# Patient Record
Sex: Female | Born: 1945
Health system: Southern US, Community
[De-identification: ages and names within clinical notes are randomized; demographics above are authoritative.]

## PROBLEM LIST (undated history)

## (undated) DIAGNOSIS — E890 Postprocedural hypothyroidism: Secondary | ICD-10-CM

## (undated) DIAGNOSIS — Z862 Personal history of diseases of the blood and blood-forming organs and certain disorders involving the immune mechanism: Secondary | ICD-10-CM

## (undated) DIAGNOSIS — R0982 Postnasal drip: Secondary | ICD-10-CM

## (undated) DIAGNOSIS — E559 Vitamin D deficiency, unspecified: Secondary | ICD-10-CM

## (undated) DIAGNOSIS — R011 Cardiac murmur, unspecified: Secondary | ICD-10-CM

## (undated) DIAGNOSIS — I10 Essential (primary) hypertension: Secondary | ICD-10-CM

## (undated) DIAGNOSIS — E210 Primary hyperparathyroidism: Secondary | ICD-10-CM

## (undated) DIAGNOSIS — E119 Type 2 diabetes mellitus without complications: Secondary | ICD-10-CM

## (undated) DIAGNOSIS — M1990 Unspecified osteoarthritis, unspecified site: Secondary | ICD-10-CM

## (undated) DIAGNOSIS — Z972 Presence of dental prosthetic device (complete) (partial): Secondary | ICD-10-CM

## (undated) DIAGNOSIS — Z8679 Personal history of other diseases of the circulatory system: Secondary | ICD-10-CM

## (undated) DIAGNOSIS — I4510 Unspecified right bundle-branch block: Secondary | ICD-10-CM

## (undated) DIAGNOSIS — Z8774 Personal history of (corrected) congenital malformations of heart and circulatory system: Secondary | ICD-10-CM

## (undated) HISTORY — PX: TONSILLECTOMY: SUR1361

## (undated) HISTORY — PX: CATARACT EXTRACTION W/ INTRAOCULAR LENS  IMPLANT, BILATERAL: SHX1307

## (undated) HISTORY — PX: BREAST LUMPECTOMY: SHX2

## (undated) HISTORY — PX: VSD REPAIR: SHX276

---

## 2004-08-24 ENCOUNTER — Encounter (HOSPITAL_COMMUNITY): Admission: RE | Admit: 2004-08-24 | Discharge: 2004-11-22 | Payer: Self-pay | Admitting: Internal Medicine

## 2005-01-27 ENCOUNTER — Encounter (HOSPITAL_COMMUNITY): Admission: RE | Admit: 2005-01-27 | Discharge: 2005-04-27 | Payer: Self-pay | Admitting: Internal Medicine

## 2005-06-15 ENCOUNTER — Ambulatory Visit: Payer: Self-pay | Admitting: Internal Medicine

## 2005-07-05 ENCOUNTER — Encounter (HOSPITAL_COMMUNITY): Admission: RE | Admit: 2005-07-05 | Discharge: 2005-10-03 | Payer: Self-pay | Admitting: Endocrinology

## 2005-08-05 ENCOUNTER — Ambulatory Visit (HOSPITAL_COMMUNITY): Admission: RE | Admit: 2005-08-05 | Discharge: 2005-08-05 | Payer: Self-pay | Admitting: Internal Medicine

## 2005-09-06 ENCOUNTER — Emergency Department (HOSPITAL_COMMUNITY): Admission: EM | Admit: 2005-09-06 | Discharge: 2005-09-07 | Payer: Self-pay | Admitting: Emergency Medicine

## 2008-01-01 ENCOUNTER — Encounter: Admission: RE | Admit: 2008-01-01 | Discharge: 2008-01-01 | Payer: Self-pay | Admitting: Endocrinology

## 2008-01-23 ENCOUNTER — Encounter: Admission: RE | Admit: 2008-01-23 | Discharge: 2008-01-23 | Payer: Self-pay | Admitting: Endocrinology

## 2008-01-23 ENCOUNTER — Other Ambulatory Visit: Admission: RE | Admit: 2008-01-23 | Discharge: 2008-01-23 | Payer: Self-pay | Admitting: Interventional Radiology

## 2008-01-23 ENCOUNTER — Encounter (INDEPENDENT_AMBULATORY_CARE_PROVIDER_SITE_OTHER): Payer: Self-pay | Admitting: Interventional Radiology

## 2008-05-06 ENCOUNTER — Encounter: Admission: RE | Admit: 2008-05-06 | Discharge: 2008-05-06 | Payer: Self-pay | Admitting: Endocrinology

## 2009-06-12 ENCOUNTER — Encounter: Admission: RE | Admit: 2009-06-12 | Discharge: 2009-06-12 | Payer: Self-pay | Admitting: Endocrinology

## 2010-12-04 ENCOUNTER — Encounter: Payer: Self-pay | Admitting: Internal Medicine

## 2011-05-02 ENCOUNTER — Other Ambulatory Visit: Payer: Self-pay | Admitting: Endocrinology

## 2011-05-02 DIAGNOSIS — E049 Nontoxic goiter, unspecified: Secondary | ICD-10-CM

## 2011-05-03 ENCOUNTER — Ambulatory Visit
Admission: RE | Admit: 2011-05-03 | Discharge: 2011-05-03 | Disposition: A | Discharge status: Home / Self Care | Payer: Medicare Other | Source: Ambulatory Visit | Attending: Endocrinology | Admitting: Endocrinology

## 2011-05-03 DIAGNOSIS — E049 Nontoxic goiter, unspecified: Secondary | ICD-10-CM

## 2013-10-14 ENCOUNTER — Other Ambulatory Visit (HOSPITAL_COMMUNITY): Payer: Self-pay | Admitting: Endocrinology

## 2013-10-14 DIAGNOSIS — Z1231 Encounter for screening mammogram for malignant neoplasm of breast: Secondary | ICD-10-CM

## 2013-10-25 ENCOUNTER — Ambulatory Visit (HOSPITAL_COMMUNITY)
Admission: RE | Admit: 2013-10-25 | Discharge: 2013-10-25 | Disposition: A | Discharge status: Home / Self Care | Payer: Medicare Other | Source: Ambulatory Visit | Attending: Endocrinology | Admitting: Endocrinology

## 2013-10-25 DIAGNOSIS — Z1231 Encounter for screening mammogram for malignant neoplasm of breast: Secondary | ICD-10-CM | POA: Insufficient documentation

## 2014-12-12 DIAGNOSIS — E119 Type 2 diabetes mellitus without complications: Secondary | ICD-10-CM | POA: Diagnosis not present

## 2014-12-12 DIAGNOSIS — Z961 Presence of intraocular lens: Secondary | ICD-10-CM | POA: Diagnosis not present

## 2014-12-12 DIAGNOSIS — H04123 Dry eye syndrome of bilateral lacrimal glands: Secondary | ICD-10-CM | POA: Diagnosis not present

## 2014-12-12 DIAGNOSIS — H26493 Other secondary cataract, bilateral: Secondary | ICD-10-CM | POA: Diagnosis not present

## 2015-04-03 DIAGNOSIS — E780 Pure hypercholesterolemia: Secondary | ICD-10-CM | POA: Diagnosis not present

## 2015-04-03 DIAGNOSIS — E039 Hypothyroidism, unspecified: Secondary | ICD-10-CM | POA: Diagnosis not present

## 2015-04-03 DIAGNOSIS — R7301 Impaired fasting glucose: Secondary | ICD-10-CM | POA: Diagnosis not present

## 2015-04-08 DIAGNOSIS — R7301 Impaired fasting glucose: Secondary | ICD-10-CM | POA: Diagnosis not present

## 2015-04-08 DIAGNOSIS — M810 Age-related osteoporosis without current pathological fracture: Secondary | ICD-10-CM | POA: Diagnosis not present

## 2015-04-08 DIAGNOSIS — E039 Hypothyroidism, unspecified: Secondary | ICD-10-CM | POA: Diagnosis not present

## 2015-04-08 DIAGNOSIS — I10 Essential (primary) hypertension: Secondary | ICD-10-CM | POA: Diagnosis not present

## 2015-06-02 DIAGNOSIS — E039 Hypothyroidism, unspecified: Secondary | ICD-10-CM | POA: Diagnosis not present

## 2015-06-22 DIAGNOSIS — W57XXXA Bitten or stung by nonvenomous insect and other nonvenomous arthropods, initial encounter: Secondary | ICD-10-CM | POA: Diagnosis not present

## 2015-06-22 DIAGNOSIS — S80862A Insect bite (nonvenomous), left lower leg, initial encounter: Secondary | ICD-10-CM | POA: Diagnosis not present

## 2015-09-01 DIAGNOSIS — H43812 Vitreous degeneration, left eye: Secondary | ICD-10-CM | POA: Diagnosis not present

## 2015-10-05 DIAGNOSIS — E7800 Pure hypercholesterolemia, unspecified: Secondary | ICD-10-CM | POA: Diagnosis not present

## 2015-10-05 DIAGNOSIS — H43812 Vitreous degeneration, left eye: Secondary | ICD-10-CM | POA: Diagnosis not present

## 2015-10-05 DIAGNOSIS — E039 Hypothyroidism, unspecified: Secondary | ICD-10-CM | POA: Diagnosis not present

## 2015-10-05 DIAGNOSIS — R7301 Impaired fasting glucose: Secondary | ICD-10-CM | POA: Diagnosis not present

## 2015-11-10 DIAGNOSIS — E039 Hypothyroidism, unspecified: Secondary | ICD-10-CM | POA: Diagnosis not present

## 2015-11-17 DIAGNOSIS — R7301 Impaired fasting glucose: Secondary | ICD-10-CM | POA: Diagnosis not present

## 2015-11-17 DIAGNOSIS — M810 Age-related osteoporosis without current pathological fracture: Secondary | ICD-10-CM | POA: Diagnosis not present

## 2015-11-17 DIAGNOSIS — I10 Essential (primary) hypertension: Secondary | ICD-10-CM | POA: Diagnosis not present

## 2015-11-17 DIAGNOSIS — E039 Hypothyroidism, unspecified: Secondary | ICD-10-CM | POA: Diagnosis not present

## 2016-01-12 DIAGNOSIS — E039 Hypothyroidism, unspecified: Secondary | ICD-10-CM | POA: Diagnosis not present

## 2016-01-12 DIAGNOSIS — R7301 Impaired fasting glucose: Secondary | ICD-10-CM | POA: Diagnosis not present

## 2016-01-17 DIAGNOSIS — J069 Acute upper respiratory infection, unspecified: Secondary | ICD-10-CM | POA: Diagnosis not present

## 2016-02-02 DIAGNOSIS — R300 Dysuria: Secondary | ICD-10-CM | POA: Diagnosis not present

## 2016-03-22 DIAGNOSIS — E039 Hypothyroidism, unspecified: Secondary | ICD-10-CM | POA: Diagnosis not present

## 2016-05-08 DIAGNOSIS — S40862A Insect bite (nonvenomous) of left upper arm, initial encounter: Secondary | ICD-10-CM | POA: Diagnosis not present

## 2016-05-08 DIAGNOSIS — W57XXXA Bitten or stung by nonvenomous insect and other nonvenomous arthropods, initial encounter: Secondary | ICD-10-CM | POA: Diagnosis not present

## 2016-05-31 DIAGNOSIS — E039 Hypothyroidism, unspecified: Secondary | ICD-10-CM | POA: Diagnosis not present

## 2016-05-31 DIAGNOSIS — E7800 Pure hypercholesterolemia, unspecified: Secondary | ICD-10-CM | POA: Diagnosis not present

## 2016-05-31 DIAGNOSIS — R7301 Impaired fasting glucose: Secondary | ICD-10-CM | POA: Diagnosis not present

## 2016-06-06 DIAGNOSIS — I10 Essential (primary) hypertension: Secondary | ICD-10-CM | POA: Diagnosis not present

## 2016-06-06 DIAGNOSIS — E039 Hypothyroidism, unspecified: Secondary | ICD-10-CM | POA: Diagnosis not present

## 2016-06-06 DIAGNOSIS — R7301 Impaired fasting glucose: Secondary | ICD-10-CM | POA: Diagnosis not present

## 2016-06-06 DIAGNOSIS — M810 Age-related osteoporosis without current pathological fracture: Secondary | ICD-10-CM | POA: Diagnosis not present

## 2016-07-06 DIAGNOSIS — R2232 Localized swelling, mass and lump, left upper limb: Secondary | ICD-10-CM | POA: Diagnosis not present

## 2016-10-03 DIAGNOSIS — E039 Hypothyroidism, unspecified: Secondary | ICD-10-CM | POA: Diagnosis not present

## 2016-12-07 DIAGNOSIS — E7800 Pure hypercholesterolemia, unspecified: Secondary | ICD-10-CM | POA: Diagnosis not present

## 2016-12-07 DIAGNOSIS — I10 Essential (primary) hypertension: Secondary | ICD-10-CM | POA: Diagnosis not present

## 2016-12-07 DIAGNOSIS — E039 Hypothyroidism, unspecified: Secondary | ICD-10-CM | POA: Diagnosis not present

## 2016-12-07 DIAGNOSIS — M810 Age-related osteoporosis without current pathological fracture: Secondary | ICD-10-CM | POA: Diagnosis not present

## 2016-12-07 DIAGNOSIS — R7301 Impaired fasting glucose: Secondary | ICD-10-CM | POA: Diagnosis not present

## 2016-12-07 DIAGNOSIS — Z23 Encounter for immunization: Secondary | ICD-10-CM | POA: Diagnosis not present

## 2016-12-23 ENCOUNTER — Other Ambulatory Visit: Payer: Self-pay | Admitting: Endocrinology

## 2016-12-23 DIAGNOSIS — M810 Age-related osteoporosis without current pathological fracture: Secondary | ICD-10-CM

## 2016-12-23 DIAGNOSIS — Z1231 Encounter for screening mammogram for malignant neoplasm of breast: Secondary | ICD-10-CM

## 2017-04-10 DIAGNOSIS — L0390 Cellulitis, unspecified: Secondary | ICD-10-CM | POA: Diagnosis not present

## 2017-04-10 DIAGNOSIS — W57XXXA Bitten or stung by nonvenomous insect and other nonvenomous arthropods, initial encounter: Secondary | ICD-10-CM | POA: Diagnosis not present

## 2017-05-30 DIAGNOSIS — R7301 Impaired fasting glucose: Secondary | ICD-10-CM | POA: Diagnosis not present

## 2017-05-30 DIAGNOSIS — E7800 Pure hypercholesterolemia, unspecified: Secondary | ICD-10-CM | POA: Diagnosis not present

## 2017-05-30 DIAGNOSIS — E559 Vitamin D deficiency, unspecified: Secondary | ICD-10-CM | POA: Diagnosis not present

## 2017-05-30 DIAGNOSIS — E039 Hypothyroidism, unspecified: Secondary | ICD-10-CM | POA: Diagnosis not present

## 2017-07-26 DIAGNOSIS — R7301 Impaired fasting glucose: Secondary | ICD-10-CM | POA: Diagnosis not present

## 2017-07-26 DIAGNOSIS — E039 Hypothyroidism, unspecified: Secondary | ICD-10-CM | POA: Diagnosis not present

## 2017-07-26 DIAGNOSIS — I10 Essential (primary) hypertension: Secondary | ICD-10-CM | POA: Diagnosis not present

## 2017-07-26 DIAGNOSIS — M810 Age-related osteoporosis without current pathological fracture: Secondary | ICD-10-CM | POA: Diagnosis not present

## 2017-08-10 ENCOUNTER — Ambulatory Visit
Admission: RE | Admit: 2017-08-10 | Discharge: 2017-08-10 | Disposition: A | Discharge status: Home / Self Care | Payer: Medicare Other | Source: Ambulatory Visit | Attending: Endocrinology | Admitting: Endocrinology

## 2017-08-10 DIAGNOSIS — Z1231 Encounter for screening mammogram for malignant neoplasm of breast: Secondary | ICD-10-CM | POA: Diagnosis not present

## 2017-08-31 DIAGNOSIS — Z23 Encounter for immunization: Secondary | ICD-10-CM | POA: Diagnosis not present

## 2017-08-31 DIAGNOSIS — I10 Essential (primary) hypertension: Secondary | ICD-10-CM | POA: Diagnosis not present

## 2018-01-29 DIAGNOSIS — R7301 Impaired fasting glucose: Secondary | ICD-10-CM | POA: Diagnosis not present

## 2018-01-29 DIAGNOSIS — E039 Hypothyroidism, unspecified: Secondary | ICD-10-CM | POA: Diagnosis not present

## 2018-01-29 DIAGNOSIS — E559 Vitamin D deficiency, unspecified: Secondary | ICD-10-CM | POA: Diagnosis not present

## 2018-01-31 ENCOUNTER — Other Ambulatory Visit: Payer: Self-pay | Admitting: Endocrinology

## 2018-01-31 DIAGNOSIS — R5381 Other malaise: Secondary | ICD-10-CM

## 2018-01-31 DIAGNOSIS — I10 Essential (primary) hypertension: Secondary | ICD-10-CM | POA: Diagnosis not present

## 2018-01-31 DIAGNOSIS — R7301 Impaired fasting glucose: Secondary | ICD-10-CM | POA: Diagnosis not present

## 2018-01-31 DIAGNOSIS — E039 Hypothyroidism, unspecified: Secondary | ICD-10-CM | POA: Diagnosis not present

## 2018-02-07 ENCOUNTER — Other Ambulatory Visit: Payer: Self-pay | Admitting: Endocrinology

## 2018-02-07 DIAGNOSIS — M8580 Other specified disorders of bone density and structure, unspecified site: Secondary | ICD-10-CM

## 2018-02-14 ENCOUNTER — Ambulatory Visit
Admission: RE | Admit: 2018-02-14 | Discharge: 2018-02-14 | Disposition: A | Discharge status: Home / Self Care | Payer: Medicare Other | Source: Ambulatory Visit | Attending: Endocrinology | Admitting: Endocrinology

## 2018-02-14 DIAGNOSIS — M85852 Other specified disorders of bone density and structure, left thigh: Secondary | ICD-10-CM | POA: Diagnosis not present

## 2018-02-14 DIAGNOSIS — M810 Age-related osteoporosis without current pathological fracture: Secondary | ICD-10-CM | POA: Diagnosis not present

## 2018-02-14 DIAGNOSIS — M8580 Other specified disorders of bone density and structure, unspecified site: Secondary | ICD-10-CM

## 2018-02-14 DIAGNOSIS — Z780 Asymptomatic menopausal state: Secondary | ICD-10-CM | POA: Diagnosis not present

## 2018-03-30 DIAGNOSIS — E559 Vitamin D deficiency, unspecified: Secondary | ICD-10-CM | POA: Diagnosis not present

## 2018-04-02 DIAGNOSIS — S80862A Insect bite (nonvenomous), left lower leg, initial encounter: Secondary | ICD-10-CM | POA: Diagnosis not present

## 2018-04-02 DIAGNOSIS — L03116 Cellulitis of left lower limb: Secondary | ICD-10-CM | POA: Diagnosis not present

## 2018-04-02 DIAGNOSIS — W57XXXA Bitten or stung by nonvenomous insect and other nonvenomous arthropods, initial encounter: Secondary | ICD-10-CM | POA: Diagnosis not present

## 2018-07-30 DIAGNOSIS — J40 Bronchitis, not specified as acute or chronic: Secondary | ICD-10-CM | POA: Diagnosis not present

## 2018-09-17 DIAGNOSIS — R7301 Impaired fasting glucose: Secondary | ICD-10-CM | POA: Diagnosis not present

## 2018-09-17 DIAGNOSIS — E559 Vitamin D deficiency, unspecified: Secondary | ICD-10-CM | POA: Diagnosis not present

## 2018-09-17 DIAGNOSIS — E039 Hypothyroidism, unspecified: Secondary | ICD-10-CM | POA: Diagnosis not present

## 2018-09-24 DIAGNOSIS — R7301 Impaired fasting glucose: Secondary | ICD-10-CM | POA: Diagnosis not present

## 2018-09-24 DIAGNOSIS — M810 Age-related osteoporosis without current pathological fracture: Secondary | ICD-10-CM | POA: Diagnosis not present

## 2018-09-24 DIAGNOSIS — E039 Hypothyroidism, unspecified: Secondary | ICD-10-CM | POA: Diagnosis not present

## 2018-09-24 DIAGNOSIS — I10 Essential (primary) hypertension: Secondary | ICD-10-CM | POA: Diagnosis not present

## 2018-09-24 DIAGNOSIS — Z23 Encounter for immunization: Secondary | ICD-10-CM | POA: Diagnosis not present

## 2018-10-04 ENCOUNTER — Other Ambulatory Visit (HOSPITAL_COMMUNITY): Payer: Self-pay | Admitting: Endocrinology

## 2018-10-04 DIAGNOSIS — E210 Primary hyperparathyroidism: Secondary | ICD-10-CM

## 2018-10-24 ENCOUNTER — Encounter (HOSPITAL_COMMUNITY)
Admission: RE | Admit: 2018-10-24 | Discharge: 2018-10-24 | Disposition: A | Discharge status: Home / Self Care | Payer: Medicare Other | Source: Ambulatory Visit | Attending: Endocrinology | Admitting: Endocrinology

## 2018-10-24 DIAGNOSIS — E213 Hyperparathyroidism, unspecified: Secondary | ICD-10-CM | POA: Diagnosis not present

## 2018-10-24 DIAGNOSIS — E210 Primary hyperparathyroidism: Secondary | ICD-10-CM

## 2018-10-24 MED ORDER — TECHNETIUM TC 99M SESTAMIBI - CARDIOLITE
20.0000 | Freq: Once | INTRAVENOUS | Status: AC | PRN
  Administered 2018-10-24: 09:00:00 20 via INTRAVENOUS

## 2018-11-29 ENCOUNTER — Other Ambulatory Visit: Payer: Self-pay | Admitting: Endocrinology

## 2018-11-29 DIAGNOSIS — R319 Hematuria, unspecified: Secondary | ICD-10-CM

## 2018-11-29 DIAGNOSIS — R350 Frequency of micturition: Secondary | ICD-10-CM | POA: Diagnosis not present

## 2018-11-29 DIAGNOSIS — Z7689 Persons encountering health services in other specified circumstances: Secondary | ICD-10-CM | POA: Diagnosis not present

## 2018-11-30 ENCOUNTER — Ambulatory Visit
Admission: RE | Admit: 2018-11-30 | Discharge: 2018-11-30 | Disposition: A | Discharge status: Home / Self Care | Payer: Medicare Other | Source: Ambulatory Visit | Attending: Endocrinology | Admitting: Endocrinology

## 2018-11-30 DIAGNOSIS — R319 Hematuria, unspecified: Secondary | ICD-10-CM | POA: Diagnosis not present

## 2019-01-07 ENCOUNTER — Ambulatory Visit: Payer: Self-pay | Admitting: Surgery

## 2019-01-07 ENCOUNTER — Other Ambulatory Visit: Payer: Self-pay | Admitting: Surgery

## 2019-01-07 DIAGNOSIS — E210 Primary hyperparathyroidism: Secondary | ICD-10-CM | POA: Diagnosis not present

## 2019-01-10 ENCOUNTER — Ambulatory Visit
Admission: RE | Admit: 2019-01-10 | Discharge: 2019-01-10 | Disposition: A | Discharge status: Home / Self Care | Payer: Medicare Other | Source: Ambulatory Visit | Attending: Surgery | Admitting: Surgery

## 2019-01-10 ENCOUNTER — Other Ambulatory Visit: Payer: Medicare Other

## 2019-01-10 DIAGNOSIS — E041 Nontoxic single thyroid nodule: Secondary | ICD-10-CM | POA: Diagnosis not present

## 2019-01-10 DIAGNOSIS — E210 Primary hyperparathyroidism: Secondary | ICD-10-CM

## 2019-01-14 ENCOUNTER — Other Ambulatory Visit: Payer: Medicare Other

## 2019-01-14 NOTE — Patient Instructions (Signed)
Laura Madden  01/14/2019   Your procedure is scheduled on:  01-22-2019   Report to [facility] Main  Entrance,  Report to admitting at  5:30 AM    Call this number if you have problems the morning of surgery [phone]        Remember: Do not eat food or drink liquids :After Midnight. This includes no water, candy, mints   BRUSH YOUR TEETH MORNING OF SURGERY AND RINSE YOUR MOUTH OUT       Take these medicines the morning of surgery with A SIP OF WATER:  Levothyroxine (synthroid)  DO NOT TAKE ANY DIABETIC MEDICATIONS DAY OF YOUR SURGERY                                  You may not have any metal on your body including hair pins and              piercings  Do not wear jewelry, make-up, lotions, powders or perfumes, deodorant             Do not wear nail polish.  Do not shave  48 hours prior to surgery.                 Do not bring valuables to the hospital. [place].  Contacts, dentures or bridgework may not be worn into surgery.       Patients discharged the day of surgery will not be allowed to drive home. IF YOU ARE HAVING SURGERY AND GOING HOME THE SAME DAY, YOU MUST HAVE AN ADULT TO DRIVE YOU HOME AND BE WITH YOU FOR 24 HOURS. YOU MAY GO HOME BY TAXI OR UBER OR ORTHERWISE, BUT AN ADULT MUST ACCOMPANY YOU HOME AND STAY WITH YOU FOR 24 HOURS.    Name and phone number of your driver:               _____________________________________________________________________             [facility] - Preparing for Surgery Before surgery, you can play an important role.  Because skin is not sterile, your skin needs to be as free of germs as possible.  You can reduce the number of germs on your skin by washing with CHG (chlorahexidine gluconate) soap before surgery.  CHG is an antiseptic cleaner which kills germs and bonds with the skin to continue killing germs even after washing. Please DO NOT  use if you have an allergy to CHG or antibacterial soaps.  If your skin becomes reddened/irritated stop using the CHG and inform your nurse when you arrive at Short Stay. Do not shave (including legs and underarms) for at least 48 hours prior to the first CHG shower.  You may shave your face/neck. Please follow these instructions carefully:  1.  Shower with CHG Soap the night before surgery and the  morning of Surgery.  2.  If you choose to wash your hair, wash your hair first as usual with your  normal  shampoo.  3.  After you shampoo, rinse your hair and body thoroughly to remove the  shampoo.  4.  Use CHG as you would any other liquid soap.  You can apply chg directly  to the skin and wash                       Gently with a scrungie or clean washcloth.  5.  Apply the CHG Soap to your body ONLY FROM THE NECK DOWN.   Do not use on face/ open                           Wound or open sores. Avoid contact with eyes, ears mouth and genitals (private parts).                       Wash face,  Genitals (private parts) with your normal soap.             6.  Wash thoroughly, paying special attention to the area where your surgery  will be performed.  7.  Thoroughly rinse your body with warm water from the neck down.  8.  DO NOT shower/wash with your normal soap after using and rinsing off  the CHG Soap.             9.  Pat yourself dry with a clean towel.            10.  Wear clean pajamas.            11.  Place clean sheets on your bed the night of your first shower and do not  sleep with pets. Day of Surgery : Do not apply any lotions/deodorants the morning of surgery.  Please wear clean clothes to the hospital/surgery center.  FAILURE TO FOLLOW THESE INSTRUCTIONS MAY RESULT IN THE CANCELLATION OF YOUR SURGERY PATIENT SIGNATURE_________________________________  NURSE  SIGNATURE__________________________________  ________________________________________________________________________

## 2019-01-15 ENCOUNTER — Encounter (HOSPITAL_COMMUNITY)
Admission: RE | Admit: 2019-01-15 | Discharge: 2019-01-15 | Disposition: A | Discharge status: Home / Self Care | Payer: Medicare Other | Source: Ambulatory Visit | Attending: Surgery | Admitting: Surgery

## 2019-01-15 ENCOUNTER — Ambulatory Visit (HOSPITAL_COMMUNITY)
Admission: RE | Admit: 2019-01-15 | Discharge: 2019-01-15 | Disposition: A | Discharge status: Home / Self Care | Payer: Medicare Other | Source: Ambulatory Visit | Attending: Anesthesiology | Admitting: Anesthesiology

## 2019-01-15 ENCOUNTER — Encounter (HOSPITAL_COMMUNITY): Payer: Self-pay

## 2019-01-15 ENCOUNTER — Other Ambulatory Visit: Payer: Self-pay

## 2019-01-15 DIAGNOSIS — Z01818 Encounter for other preprocedural examination: Secondary | ICD-10-CM | POA: Insufficient documentation

## 2019-01-15 DIAGNOSIS — R05 Cough: Secondary | ICD-10-CM | POA: Diagnosis not present

## 2019-01-15 HISTORY — DX: Postprocedural hypothyroidism: E89.0

## 2019-01-15 HISTORY — DX: Unspecified osteoarthritis, unspecified site: M19.90

## 2019-01-15 HISTORY — DX: Vitamin D deficiency, unspecified: E55.9

## 2019-01-15 HISTORY — DX: Personal history of other diseases of the circulatory system: Z86.79

## 2019-01-15 HISTORY — DX: Essential (primary) hypertension: I10

## 2019-01-15 HISTORY — DX: Type 2 diabetes mellitus without complications: E11.9

## 2019-01-15 HISTORY — DX: Primary hyperparathyroidism: E21.0

## 2019-01-15 HISTORY — DX: Postnasal drip: R09.82

## 2019-01-15 HISTORY — DX: Unspecified right bundle-branch block: I45.10

## 2019-01-15 HISTORY — DX: Personal history of diseases of the blood and blood-forming organs and certain disorders involving the immune mechanism: Z86.2

## 2019-01-15 HISTORY — DX: Cardiac murmur, unspecified: R01.1

## 2019-01-15 HISTORY — DX: Presence of dental prosthetic device (complete) (partial): Z97.2

## 2019-01-15 HISTORY — DX: Personal history of (corrected) congenital malformations of heart and circulatory system: Z87.74

## 2019-01-15 LAB — CBC
HEMATOCRIT: 47 % — AB (ref 36.0–46.0)
Hemoglobin: 14.8 g/dL (ref 12.0–15.0)
MCH: 30.5 pg (ref 26.0–34.0)
MCHC: 31.5 g/dL (ref 30.0–36.0)
MCV: 96.9 fL (ref 80.0–100.0)
Platelets: 221 10*3/uL (ref 150–400)
RBC: 4.85 MIL/uL (ref 3.87–5.11)
RDW: 12.8 % (ref 11.5–15.5)
WBC: 11.3 10*3/uL — ABNORMAL HIGH (ref 4.0–10.5)
nRBC: 0 % (ref 0.0–0.2)

## 2019-01-15 LAB — BASIC METABOLIC PANEL
Anion gap: 7 (ref 5–15)
BUN: 14 mg/dL (ref 8–23)
CO2: 24 mmol/L (ref 22–32)
Calcium: 10.3 mg/dL (ref 8.9–10.3)
Chloride: 105 mmol/L (ref 98–111)
Creatinine, Ser: 0.6 mg/dL (ref 0.44–1.00)
GFR calc Af Amer: >60 mL/min (ref >60)
GFR calc non Af Amer: >60 mL/min (ref >60)
GLUCOSE: 107 mg/dL — AB (ref 70–99)
Potassium: 4.2 mmol/L (ref 3.5–5.1)
Sodium: 136 mmol/L (ref 135–145)

## 2019-01-15 LAB — HEMOGLOBIN A1C
Hgb A1c MFr Bld: 5.8 % — ABNORMAL HIGH (ref 4.8–5.6)
Mean Plasma Glucose: 119.76 mg/dL

## 2019-01-15 LAB — GLUCOSE, CAPILLARY: Glucose-Capillary: 140 mg/dL — ABNORMAL HIGH (ref 70–99)

## 2019-01-15 MED ORDER — CHLORHEXIDINE GLUCONATE CLOTH 2 % EX PADS
6.0000 | MEDICATED_PAD | Freq: Once | CUTANEOUS | Status: DC
  Filled 2019-01-15: qty 6

## 2019-01-15 NOTE — Progress Notes (Addendum)
Final CXR dated 01-15-2019 in epic.  Addendum:  Final EKG dated 01-15-2019 in epic. Chart to anesthesia for review, Konrad Felix PA.

## 2019-01-17 NOTE — Progress Notes (Signed)
Anesthesia Chart Review   Case:  [id number] Date/Time:  01/22/19 0715   Procedure:  LEFT INFERIOR PARATHYROIDECTOMY (Left )   Anesthesia type:  General   Pre-op diagnosis:  primary hyperparathyroidism   Location:  [facility] ROOM 04 / [facility]   Surgeon:  Armandina Gemma, MD      DISCUSSION: 73 yo former smoker (quit 01/14/65) with h/o hypothyroidism, HTN, RBBB, DM II, s/p VSD repair age 39, primary hyperparathyroidism scheduled for above procedure 01/22/19 with Dr. Armandina Gemma.   Pt can proceed with planned procedure barring acute status change.  VS: BP (!) 162/88   Pulse 87   Temp 37.3 C (Oral)   Resp 14   Ht 5\' 7"  (1.702 m)   Wt 69.5 kg   SpO2 100%   BMI 24.00 kg/m   PROVIDERS: Jani Gravel, MD is PCP   LABS: Labs reviewed: Acceptable for surgery. (all labs ordered are listed, but only abnormal results are displayed)  Labs Reviewed  HEMOGLOBIN A1C - Abnormal; Notable for the following components:      Result Value   Hgb A1c MFr Bld 5.8 (*)    All other components within normal limits  BASIC METABOLIC PANEL - Abnormal; Notable for the following components:   Glucose, Bld 107 (*)    All other components within normal limits  CBC - Abnormal; Notable for the following components:   WBC 11.3 (*)    HCT 47.0 (*)    All other components within normal limits  GLUCOSE, CAPILLARY - Abnormal; Notable for the following components:   Glucose-Capillary 140 (*)    All other components within normal limits     IMAGES: Chest Xray 01/15/2019 IMPRESSION: Mild streaky anterior lung base opacities partially obscuring the heart borders bilaterally, favor scarring or atelectasis. No acute consolidative airspace disease to suggest a pneumonia.  EKG: 01/15/2019 Rate 79 bpm Normal sinus rhythm  Left axis deviation  Right bundle branch block  Abnormal ecg  CV:  Past Medical History:  Diagnosis Date  . Arthritis    hips  . Heart murmur   . History of anemia   . History of CHF (congestive  heart failure)    as child--- resolved after VSP repair age 61  . Hypertension   . Hypothyroidism, postradioiodine therapy endocrinologist-- dr balan   x2  --  10/ 2005 and 09/ 2006  . Post-nasal drip    occasional cough   . Primary hyperparathyroidism ([place])   . RBBB (right bundle branch block)   . S/P VSD repair age 42   for congenital PFO---  per pt last visit to cardiologist at [facility] approx. 2006  . Type 2 diabetes mellitus ([place])    endocriologist-- dr Chalmers Cater  . Vitamin D deficiency   . Wears partial dentures    lower    Past Surgical History:  Procedure Laterality Date  . BREAST LUMPECTOMY Left age 47  . CATARACT EXTRACTION W/ INTRAOCULAR LENS  IMPLANT, BILATERAL  2013 approx.  . TONSILLECTOMY  age 38  . VSD REPAIR  age 25    MEDICATIONS: . Cholecalciferol (VITAMIN D3) 1.25 MG (50000 UT) CAPS  . levothyroxine (SYNTHROID, LEVOTHROID) 100 MCG tablet  . levothyroxine (SYNTHROID, LEVOTHROID) 88 MCG tablet  . lisinopril (PRINIVIL,ZESTRIL) 10 MG tablet  . metFORMIN (GLUCOPHAGE-XR) 750 MG 24 hr tablet  . Polyethyl Glycol-Propyl Glycol (SYSTANE OP)   No current facility-administered medications for this encounter.    Maia Plan [facility] Pre-Surgical Testing [phone] 01/17/19 4:39 PM

## 2019-01-21 ENCOUNTER — Encounter (HOSPITAL_COMMUNITY): Payer: Self-pay | Admitting: Surgery

## 2019-01-21 DIAGNOSIS — E210 Primary hyperparathyroidism: Secondary | ICD-10-CM | POA: Diagnosis present

## 2019-01-21 NOTE — Anesthesia Preprocedure Evaluation (Addendum)
Anesthesia Evaluation  Patient identified by MRN, date of birth, ID band Patient awake    Reviewed: Allergy & Precautions, NPO status , Patient's Chart, lab work & pertinent test results  Airway Mallampati: II  TM Distance: >3 FB Neck ROM: Full    Dental no notable dental hx. (+) Teeth Intact, Dental Advisory Given   Pulmonary neg pulmonary ROS, former smoker,    Pulmonary exam normal breath sounds clear to auscultation       Cardiovascular hypertension, Normal cardiovascular exam+ Valvular Problems/Murmurs (h/o VSD s/p repair at age 33)  Rhythm:Regular Rate:Normal  TTE 2013 NORMAL LEFT VENTRICULAR FUNCTION VALVULAR REGURGITATION: MILD MR, TRIVIAL PR, MILD TR NO VALVULAR STENOSIS MODERATE PULMONARY HYPERTENSION; POSITIVE MICROCAVITATION STUDY VSD REPAIR IN '59, NO RESIDUAL SHUNTING NOTED  RHC 2013 Mildly elevated right sided pressures related to diastolic dysfunction with elevated PCWP and LVEDP. Systemic hypertension.  Unable to cross or locate PFO. No evidence for anomalous pulmonary venous return.  High output likely related to hyperthyroidism   Neuro/Psych negative neurological ROS  negative psych ROS   GI/Hepatic negative GI ROS, Neg liver ROS,   Endo/Other  diabetes, Oral Hypoglycemic AgentsHypothyroidism Primary hyperparathyroidism  Renal/GU negative Renal ROS  negative genitourinary   Musculoskeletal  (+) Arthritis ,   Abdominal   Peds  Hematology negative hematology ROS (+)   Anesthesia Other Findings   Reproductive/Obstetrics                            Anesthesia Physical Anesthesia Plan  ASA: III  Anesthesia Plan: General   Post-op Pain Management:    Induction: Intravenous  PONV Risk Score and Plan: 3 and Ondansetron, Dexamethasone and Treatment may vary due to age or medical condition  Airway Management Planned: Oral ETT  Additional Equipment:   Intra-op Plan:    Post-operative Plan: Extubation in OR  Informed Consent: I have reviewed the patients History and Physical, chart, labs and discussed the procedure including the risks, benefits and alternatives for the proposed anesthesia with the patient or authorized representative who has indicated his/her understanding and acceptance.     Dental advisory given  Plan Discussed with: CRNA  Anesthesia Plan Comments:         Anesthesia Quick Evaluation

## 2019-01-21 NOTE — H&P (Signed)
General Surgery [facility]  Laura Madden DOB: Aug 07, 1946 Married / Language: Cleophus Molt / Race: White Female   History of Present Illness   The patient is a 73 year old female who presents with primary hyperparathyroidism.  CHIEF COMPLAINT: primary hyperparathyroidism  Patient is referred by Dr. Jacelyn Pi for surgical evaluation and management of primary hyperparathyroidism. Patient was noted to have elevated serum calcium levels. These were persistently elevated with her most recent level being 10.7. Concurrent with this was an intact PTH level of 47 which was not suppressed. Vitamin D level was normal at 35. Patient underwent nuclear medicine parathyroid scan which was performed on October 24, 2018. This demonstrated a left inferior parathyroid adenoma. Patient has not had other imaging studies. She does have a history of hyperthyroidism. She has been treated on 2 occasions with radioactive iodine approximately 10 years ago. She currently takes levothyroxine 88 g during the week and 100 g on the weekend. She has not had a recent thyroid ultrasound. Patient notes fatigue. She notes right hip pain. She does have a history of nephrolithiasis. She also has a history of bone loss with osteoporosis. She is currently taking vitamin D supplementation weekly. Patient has had no prior head or neck surgery.   Past Surgical History Breast Mass; Local Excision  Left. Cesarean Section - 1  Oral Surgery  Tonsillectomy   Diagnostic Studies History Mammogram  1-3 years ago Pap Smear  >5 years ago  Allergies Penicillins  Hives. Codeine and Related  Nausea. Allergies Reconciled   Medication History Lisinopril (10MG  Tablet, Oral) Active. Synthroid (88MCG Tablet, Oral) Active. metFORMIN HCl ER (750MG  Tablet ER 24HR, Oral) Active. Vitamin D (25000IU Capsule, Oral) Active. Synthroid (100MCG Tablet, Oral) Active. Medications  Reconciled  Family History  Diabetes Mellitus  Father. Heart Disease  Father. Heart disease in female family member before age 53  Heart disease in female family member before age 61  Hypertension  Father. Thyroid problems  Mother.  Pregnancy / Birth History Age at menarche  31 years. Age of menopause  51-55 Contraceptive History  Oral contraceptives. Gravida  1 Length (months) of breastfeeding  3-6 Maternal age  44-35 Para  1  Other Problems Diabetes Mellitus  Heart murmur  High blood pressure  Kidney Stone  Lump In Breast  Thyroid Disease   Review of Systems Respiratory Present- Chronic Cough and Snoring. Not Present- Bloody sputum, Difficulty Breathing and Wheezing. Gastrointestinal Not Present- Abdominal Pain, Bloating, Bloody Stool, Change in Bowel Habits, Chronic diarrhea, Constipation, Difficulty Swallowing, Excessive gas, Gets full quickly at meals, Hemorrhoids, Indigestion, Nausea, Rectal Pain and Vomiting. Female Genitourinary Present- Urgency. Not Present- Frequency, Nocturia, Painful Urination and Pelvic Pain. Musculoskeletal Present- Joint Pain. Not Present- Back Pain, Joint Stiffness, Muscle Pain, Muscle Weakness and Swelling of Extremities.  Vitals Weight: 156 lb Height: 66in Body Surface Area: 1.8 m Body Mass Index: 25.18 kg/m  Temp.: 97.17F(Temporal)  Pulse: 80 (Regular)  BP: 126/82 (Sitting, Left Arm, Standard)  Physical Exam  See vital signs recorded above  GENERAL APPEARANCE Development: normal Nutritional status: normal Gross deformities: none  SKIN Rash, lesions, ulcers: none Induration, erythema: none Nodules: none palpable  EYES Conjunctiva and lids: normal Pupils: equal and reactive Iris: normal bilaterally  EARS, NOSE, MOUTH, THROAT External ears: no lesion or deformity External nose: no lesion or deformity Hearing: grossly normal Lips: no lesion or deformity Dentition: normal for age Oral  mucosa: moist  NECK Symmetric: yes Trachea: midline Thyroid: no palpable nodules  in the thyroid bed  CHEST Respiratory effort: normal Retraction or accessory muscle use: no Breath sounds: normal bilaterally Rales, rhonchi, wheeze: none  CARDIOVASCULAR Auscultation: regular rhythm, normal rate Murmurs: none Pulses: carotid and radial pulse 2+ palpable Lower extremity edema: none Lower extremity varicosities: none  MUSCULOSKELETAL Station and gait: normal Digits and nails: no clubbing or cyanosis Muscle strength: grossly normal all extremities Range of motion: grossly normal all extremities Deformity: none  LYMPHATIC Cervical: none palpable Supraclavicular: none palpable  PSYCHIATRIC Oriented to person, place, and time: yes Mood and affect: normal for situation Judgment and insight: appropriate for situation    Assessment & Plan  PRIMARY HYPERPARATHYROIDISM (E21.0)  Pt Education - Pamphlet Given - The Parathyroid Surgery Book: discussed with patient and provided information.  Patient is referred by her endocrinologist for surgical evaluation and management of primary hyperparathyroidism. Patient is given written literature on parathyroid surgery to review at home.  Patient has biochemical evidence of primary hyperparathyroidism. Nuclear medicine parathyroid scan has localized a left inferior parathyroid adenoma. Today we discussed the surgical intervention for parathyroid adenoma. We discussed traditional multi-gland exploration versus minimally invasive outpatient surgery. I have recommended a minimally invasive approach to the left inferior parathyroid gland. There is approximately a 95% chance that the patient has single gland disease. We discussed the risk and benefits of the surgery including the location of the surgical incision, the risk of injury to recurrent laryngeal nerves, the possibility of multi-gland disease, and the postoperative recovery. The  patient understands and wishes to proceed with surgery in the near future.  We will obtain a thyroid ultrasound prior to surgery to evaluate the thyroid gland and hopefully to confirm the location of the left inferior parathyroid adenoma.  The risks and benefits of the procedure have been discussed at length with the patient. The patient understands the proposed procedure, potential alternative treatments, and the course of recovery to be expected. All of the patient's questions have been answered at this time. The patient wishes to proceed with surgery.   Armandina Gemma, [place] Surgery Office: [phone]

## 2019-01-22 ENCOUNTER — Encounter (HOSPITAL_COMMUNITY)
Admission: RE | Disposition: A | Discharge status: Home / Self Care | Payer: Self-pay | Source: Home / Self Care | Attending: Surgery

## 2019-01-22 ENCOUNTER — Ambulatory Visit (HOSPITAL_COMMUNITY): Payer: Medicare Other | Admitting: Physician Assistant

## 2019-01-22 ENCOUNTER — Other Ambulatory Visit: Payer: Self-pay

## 2019-01-22 ENCOUNTER — Ambulatory Visit (HOSPITAL_COMMUNITY): Payer: Medicare Other | Admitting: Anesthesiology

## 2019-01-22 ENCOUNTER — Ambulatory Visit (HOSPITAL_COMMUNITY)
Admission: RE | Admit: 2019-01-22 | Discharge: 2019-01-22 | Disposition: A | Discharge status: Home / Self Care | Payer: Medicare Other | Attending: Surgery | Admitting: Surgery

## 2019-01-22 ENCOUNTER — Encounter (HOSPITAL_COMMUNITY): Payer: Self-pay

## 2019-01-22 DIAGNOSIS — I10 Essential (primary) hypertension: Secondary | ICD-10-CM | POA: Insufficient documentation

## 2019-01-22 DIAGNOSIS — M810 Age-related osteoporosis without current pathological fracture: Secondary | ICD-10-CM | POA: Insufficient documentation

## 2019-01-22 DIAGNOSIS — E039 Hypothyroidism, unspecified: Secondary | ICD-10-CM | POA: Insufficient documentation

## 2019-01-22 DIAGNOSIS — Z7984 Long term (current) use of oral hypoglycemic drugs: Secondary | ICD-10-CM | POA: Insufficient documentation

## 2019-01-22 DIAGNOSIS — E0789 Other specified disorders of thyroid: Secondary | ICD-10-CM | POA: Diagnosis not present

## 2019-01-22 DIAGNOSIS — E210 Primary hyperparathyroidism: Secondary | ICD-10-CM | POA: Diagnosis present

## 2019-01-22 DIAGNOSIS — Z79890 Hormone replacement therapy (postmenopausal): Secondary | ICD-10-CM | POA: Diagnosis not present

## 2019-01-22 DIAGNOSIS — D351 Benign neoplasm of parathyroid gland: Secondary | ICD-10-CM | POA: Diagnosis not present

## 2019-01-22 DIAGNOSIS — Z881 Allergy status to other antibiotic agents status: Secondary | ICD-10-CM | POA: Diagnosis not present

## 2019-01-22 DIAGNOSIS — Z87891 Personal history of nicotine dependence: Secondary | ICD-10-CM | POA: Diagnosis not present

## 2019-01-22 DIAGNOSIS — E041 Nontoxic single thyroid nodule: Secondary | ICD-10-CM | POA: Insufficient documentation

## 2019-01-22 DIAGNOSIS — Z880 Allergy status to penicillin: Secondary | ICD-10-CM | POA: Diagnosis not present

## 2019-01-22 DIAGNOSIS — Z79899 Other long term (current) drug therapy: Secondary | ICD-10-CM | POA: Insufficient documentation

## 2019-01-22 DIAGNOSIS — Z885 Allergy status to narcotic agent status: Secondary | ICD-10-CM | POA: Diagnosis not present

## 2019-01-22 DIAGNOSIS — E119 Type 2 diabetes mellitus without complications: Secondary | ICD-10-CM | POA: Insufficient documentation

## 2019-01-22 HISTORY — PX: PARATHYROIDECTOMY: SHX19

## 2019-01-22 LAB — GLUCOSE, CAPILLARY: Glucose-Capillary: 101 mg/dL — ABNORMAL HIGH (ref 70–99)

## 2019-01-22 SURGERY — PARATHYROIDECTOMY
Anesthesia: General | Site: Neck | Laterality: Left

## 2019-01-22 MED ORDER — ONDANSETRON HCL 4 MG/2ML IJ SOLN
INTRAMUSCULAR | Status: AC
  Filled 2019-01-22: qty 2

## 2019-01-22 MED ORDER — CEFAZOLIN SODIUM-DEXTROSE 2-4 GM/100ML-% IV SOLN
2.0000 g | INTRAVENOUS | Status: AC
  Administered 2019-01-22: 2 g via INTRAVENOUS
  Filled 2019-01-22: qty 100

## 2019-01-22 MED ORDER — ALBUTEROL SULFATE HFA 108 (90 BASE) MCG/ACT IN AERS
INHALATION_SPRAY | RESPIRATORY_TRACT | Status: AC
  Filled 2019-01-22: qty 6.7

## 2019-01-22 MED ORDER — PROPOFOL 10 MG/ML IV BOLUS
INTRAVENOUS | Status: AC
  Filled 2019-01-22: qty 20

## 2019-01-22 MED ORDER — PROPOFOL 10 MG/ML IV BOLUS
INTRAVENOUS | Status: DC | PRN
  Administered 2019-01-22: 160 mg via INTRAVENOUS

## 2019-01-22 MED ORDER — ACETAMINOPHEN 500 MG PO TABS
1000.0000 mg | ORAL_TABLET | Freq: Once | ORAL | Status: AC
  Administered 2019-01-22: 1000 mg via ORAL
  Filled 2019-01-22: qty 2

## 2019-01-22 MED ORDER — LACTATED RINGERS IV SOLN
INTRAVENOUS | Status: DC
  Administered 2019-01-22 (×2): via INTRAVENOUS

## 2019-01-22 MED ORDER — TRAMADOL HCL 50 MG PO TABS: 50.0000 mg | ORAL_TABLET | Freq: Once | ORAL | Status: DC

## 2019-01-22 MED ORDER — SCOPOLAMINE 1 MG/3DAYS TD PT72
MEDICATED_PATCH | TRANSDERMAL | Status: DC | PRN
  Administered 2019-01-22: 1 via TRANSDERMAL

## 2019-01-22 MED ORDER — PROPOFOL 500 MG/50ML IV EMUL
INTRAVENOUS | Status: DC | PRN
  Administered 2019-01-22: 150 ug/kg/min via INTRAVENOUS

## 2019-01-22 MED ORDER — FENTANYL CITRATE (PF) 100 MCG/2ML IJ SOLN
INTRAMUSCULAR | Status: DC | PRN
  Administered 2019-01-22 (×3): 50 ug via INTRAVENOUS

## 2019-01-22 MED ORDER — BUPIVACAINE HCL (PF) 0.25 % IJ SOLN
INTRAMUSCULAR | Status: AC
  Filled 2019-01-22: qty 30

## 2019-01-22 MED ORDER — DEXAMETHASONE SODIUM PHOSPHATE 10 MG/ML IJ SOLN
INTRAMUSCULAR | Status: AC
  Filled 2019-01-22: qty 1

## 2019-01-22 MED ORDER — GLYCOPYRROLATE PF 0.2 MG/ML IJ SOSY
PREFILLED_SYRINGE | INTRAMUSCULAR | Status: AC
  Filled 2019-01-22: qty 1

## 2019-01-22 MED ORDER — FENTANYL CITRATE (PF) 100 MCG/2ML IJ SOLN
25.0000 ug | INTRAMUSCULAR | Status: DC | PRN
  Administered 2019-01-22: 25 ug via INTRAVENOUS

## 2019-01-22 MED ORDER — TRAMADOL HCL 50 MG PO TABS
ORAL_TABLET | ORAL | Status: AC
  Administered 2019-01-22: 10:00:00
  Filled 2019-01-22: qty 1

## 2019-01-22 MED ORDER — LIDOCAINE 2% (20 MG/ML) 5 ML SYRINGE
INTRAMUSCULAR | Status: AC
  Filled 2019-01-22: qty 5

## 2019-01-22 MED ORDER — DEXAMETHASONE SODIUM PHOSPHATE 10 MG/ML IJ SOLN
INTRAMUSCULAR | Status: DC | PRN
  Administered 2019-01-22: 10 mg via INTRAVENOUS

## 2019-01-22 MED ORDER — FENTANYL CITRATE (PF) 100 MCG/2ML IJ SOLN
INTRAMUSCULAR | Status: AC
  Filled 2019-01-22: qty 2

## 2019-01-22 MED ORDER — 0.9 % SODIUM CHLORIDE (POUR BTL) OPTIME
TOPICAL | Status: DC | PRN
  Administered 2019-01-22: 1000 mL

## 2019-01-22 MED ORDER — TRAMADOL HCL 50 MG PO TABS: 50.0000 mg | ORAL_TABLET | Freq: Four times a day (QID) | ORAL | 0 refills | Status: AC | PRN

## 2019-01-22 MED ORDER — SUGAMMADEX SODIUM 200 MG/2ML IV SOLN
INTRAVENOUS | Status: AC
  Filled 2019-01-22: qty 2

## 2019-01-22 MED ORDER — ONDANSETRON HCL 4 MG/2ML IJ SOLN
INTRAMUSCULAR | Status: DC | PRN
  Administered 2019-01-22: 4 mg via INTRAVENOUS

## 2019-01-22 MED ORDER — ROCURONIUM BROMIDE 10 MG/ML (PF) SYRINGE
PREFILLED_SYRINGE | INTRAVENOUS | Status: DC | PRN
  Administered 2019-01-22: 60 mg via INTRAVENOUS

## 2019-01-22 MED ORDER — BUPIVACAINE HCL 0.25 % IJ SOLN
INTRAMUSCULAR | Status: DC | PRN
  Administered 2019-01-22: 8 mL

## 2019-01-22 MED ORDER — LIDOCAINE 2% (20 MG/ML) 5 ML SYRINGE
INTRAMUSCULAR | Status: DC | PRN
  Administered 2019-01-22: 100 mg via INTRAVENOUS

## 2019-01-22 MED ORDER — ROCURONIUM BROMIDE 10 MG/ML (PF) SYRINGE
PREFILLED_SYRINGE | INTRAVENOUS | Status: AC
  Filled 2019-01-22: qty 10

## 2019-01-22 MED ORDER — SUGAMMADEX SODIUM 200 MG/2ML IV SOLN
INTRAVENOUS | Status: DC | PRN
  Administered 2019-01-22: 200 mg via INTRAVENOUS

## 2019-01-22 MED ORDER — SCOPOLAMINE 1 MG/3DAYS TD PT72
MEDICATED_PATCH | TRANSDERMAL | Status: AC
  Filled 2019-01-22: qty 1

## 2019-01-22 SURGICAL SUPPLY — 38 items
ADH SKN CLS APL DERMABOND .7 (GAUZE/BANDAGES/DRESSINGS) ×1
ATTRACTOMAT 16X20 MAGNETIC DRP (DRAPES) ×3 IMPLANT
BLADE SURG 15 STRL LF DISP TIS (BLADE) ×1 IMPLANT
BLADE SURG 15 STRL SS (BLADE) ×3
CHLORAPREP W/TINT 26ML (MISCELLANEOUS) ×3 IMPLANT
CLIP VESOCCLUDE MED 6/CT (CLIP) ×6 IMPLANT
CLIP VESOCCLUDE SM WIDE 6/CT (CLIP) ×6 IMPLANT
COVER SURGICAL LIGHT HANDLE (MISCELLANEOUS) ×3 IMPLANT
COVER WAND RF STERILE (DRAPES) ×3 IMPLANT
DERMABOND ADVANCED (GAUZE/BANDAGES/DRESSINGS) ×2
DERMABOND ADVANCED .7 DNX12 (GAUZE/BANDAGES/DRESSINGS) IMPLANT
DRAPE LAPAROTOMY T 98X78 PEDS (DRAPES) ×3 IMPLANT
ELECT PENCIL ROCKER SW 15FT (MISCELLANEOUS) ×3 IMPLANT
ELECT REM PT RETURN 15FT ADLT (MISCELLANEOUS) ×3 IMPLANT
GAUZE 4X4 16PLY RFD (DISPOSABLE) ×3 IMPLANT
GLOVE BIOGEL PI IND STRL 6 (GLOVE) IMPLANT
GLOVE BIOGEL PI IND STRL 7.0 (GLOVE) IMPLANT
GLOVE BIOGEL PI INDICATOR 6 (GLOVE) ×2
GLOVE BIOGEL PI INDICATOR 7.0 (GLOVE) ×4
GLOVE ECLIPSE 6.5 STRL STRAW (GLOVE) ×2 IMPLANT
GLOVE SURG ORTHO 8.0 STRL STRW (GLOVE) ×3 IMPLANT
GLOVE SURG SS PI 7.0 STRL IVOR (GLOVE) ×2 IMPLANT
GOWN STRL REUS W/TWL XL LVL3 (GOWN DISPOSABLE) ×9 IMPLANT
HEMOSTAT SURGICEL 2X4 FIBR (HEMOSTASIS) ×2 IMPLANT
ILLUMINATOR WAVEGUIDE N/F (MISCELLANEOUS) IMPLANT
KIT BASIN OR (CUSTOM PROCEDURE TRAY) ×3 IMPLANT
KIT TURNOVER KIT A (KITS) IMPLANT
NDL HYPO 25X1 1.5 SAFETY (NEEDLE) ×1 IMPLANT
NEEDLE HYPO 25X1 1.5 SAFETY (NEEDLE) ×3 IMPLANT
PACK BASIC VI WITH GOWN DISP (CUSTOM PROCEDURE TRAY) ×3 IMPLANT
SUT MNCRL AB 4-0 PS2 18 (SUTURE) ×3 IMPLANT
SUT VIC AB 3-0 SH 18 (SUTURE) ×3 IMPLANT
SYR BULB IRRIGATION 50ML (SYRINGE) ×3 IMPLANT
SYR CONTROL 10ML LL (SYRINGE) ×3 IMPLANT
TOWEL OR 17X26 10 PK STRL BLUE (TOWEL DISPOSABLE) ×3 IMPLANT
TOWEL OR NON WOVEN STRL DISP B (DISPOSABLE) ×3 IMPLANT
TUBING CONNECTING 10 (TUBING) ×2 IMPLANT
TUBING CONNECTING 10' (TUBING) ×1

## 2019-01-22 NOTE — Anesthesia Procedure Notes (Signed)
Procedure Name: Intubation Date/Time: 01/22/2019 7:39 AM Performed by: Lind Covert, CRNA Pre-anesthesia Checklist: Patient identified, Emergency Drugs available, Suction available, Patient being monitored and Timeout performed Patient Re-evaluated:Patient Re-evaluated prior to induction Oxygen Delivery Method: Circle system utilized Preoxygenation: Pre-oxygenation with 100% oxygen Induction Type: IV induction Ventilation: Mask ventilation without difficulty Laryngoscope Size: Mac and 3 Grade View: Grade I Tube type: Oral Tube size: 7.0 mm Number of attempts: 1 Airway Equipment and Method: Stylet Placement Confirmation: ETT inserted through vocal cords under direct vision,  positive ETCO2 and breath sounds checked- equal and bilateral Secured at: 21 cm Tube secured with: Tape Dental Injury: Teeth and Oropharynx as per pre-operative assessment

## 2019-01-22 NOTE — Transfer of Care (Signed)
Immediate Anesthesia Transfer of Care Note  Patient: Laura Madden  Procedure(s) Performed: LEFT INFERIOR PARATHYROIDECTOMY (Left Neck)  Patient Location: PACU  Anesthesia Type:General  Level of Consciousness: sedated  Airway & Oxygen Therapy: Patient Spontanous Breathing and Patient connected to face mask oxygen  Post-op Assessment: Report given to RN and Post -op Vital signs reviewed and stable  Post vital signs: Reviewed and stable  Last Vitals:  Vitals Value Taken Time  BP 176/81 01/22/2019  8:52 AM  Temp    Pulse 106 01/22/2019  8:53 AM  Resp 19 01/22/2019  8:53 AM  SpO2 95 % 01/22/2019  8:53 AM  Vitals shown include unvalidated device data.  Last Pain:  Vitals:   01/22/19 0606  TempSrc:   PainSc: 0-No pain      Patients Stated Pain Goal: 3 (54/88/45 7334)  Complications: No apparent anesthesia complications

## 2019-01-22 NOTE — Anesthesia Postprocedure Evaluation (Signed)
Anesthesia Post Note  Patient: Laura Madden  Procedure(s) Performed: LEFT INFERIOR PARATHYROIDECTOMY (Left Neck)     Patient location during evaluation: PACU Anesthesia Type: General Level of consciousness: awake and alert Pain management: pain level controlled Vital Signs Assessment: post-procedure vital signs reviewed and stable Respiratory status: spontaneous breathing, nonlabored ventilation, respiratory function stable and patient connected to nasal cannula oxygen Cardiovascular status: blood pressure returned to baseline and stable Postop Assessment: no apparent nausea or vomiting Anesthetic complications: no    Last Vitals:  Vitals:   01/22/19 1000 01/22/19 1021  BP:  (!) 159/93  Pulse: 80   Resp: (!) 22   Temp:  36.9 C  SpO2:  98%    Last Pain:  Vitals:   01/22/19 1021  TempSrc:   PainSc: 3                  Vaishali Baise L Gilma Bessette

## 2019-01-22 NOTE — Interval H&P Note (Signed)
History and Physical Interval Note:  01/22/2019 7:12 AM  Laura Madden  has presented today for surgery, with the diagnosis of primary hyperparathyroidism.  The various methods of treatment have been discussed with the patient and family. After consideration of risks, benefits and other options for treatment, the patient has consented to    Procedure(s): LEFT INFERIOR PARATHYROIDECTOMY (Left) as a surgical intervention.    The patient's history has been reviewed, patient examined, no change in status, stable for surgery.  I have reviewed the patient's chart and labs.  Questions were answered to the patient's satisfaction.    Armandina Gemma, [place] Surgery Office: [place]

## 2019-01-22 NOTE — Op Note (Signed)
OPERATIVE REPORT - PARATHYROIDECTOMY  Preoperative diagnosis: Primary hyperparathyroidism  Postop diagnosis: Same  Procedure: Left minimally invasive parathyroidectomy, excision of nodule  Surgeon:  Armandina Gemma, MD  Anesthesia: General endotracheal  Estimated blood loss: Minimal  Preparation: ChloraPrep  Indications: Patient is referred by Dr. Jacelyn Pi for surgical evaluation and management of primary hyperparathyroidism. Patient was noted to have elevated serum calcium levels. These were persistently elevated with her most recent level being 10.7. Concurrent with this was an intact PTH level of 47 which was not suppressed. Vitamin D level was normal at 35. Patient underwent nuclear medicine parathyroid scan which was performed on October 24, 2018. This demonstrated a left inferior parathyroid adenoma.   Procedure: The patient was prepared in the pre-operative holding area. The patient was brought to the operating room and placed in a supine position on the operating room table. Following administration of general anesthesia, the patient was positioned and then prepped and draped in the usual strict aseptic fashion. After ascertaining that an adequate level of anesthesia been achieved, a neck incision was made with a #15 blade. Dissection was carried through subcutaneous tissues and platysma. Hemostasis was obtained with the electrocautery. Skin flaps were developed circumferentially and a Weitlander retractor was placed for exposure.  Strap muscles were incised in the midline. Strap muscles were reflected lateralley exposing the thyroid lobe. With gentle blunt dissection the thyroid lobe was mobilized.  Dissection was carried through adipose tissue and an enlarged parathyroid gland was identified. It was gently mobilized. Vascular structures were divided between small ligaclips. Care was taken to avoid the recurrent laryngeal nerve and the esophagus. The parathyroid gland was  completely excised. It was submitted to pathology where frozen section confirmed parathyroid tissue consistent with adenoma.  There was a calcified nodule anterior to the trachea and just to the left of midline.  This appeared to be a lymph node.  It was mobilized and excised using the electrocautery for hemostasis.  It was submitted to pathology for evaluation.  Neck was irrigated with warm saline and good hemostasis was noted. Fibrillar was placed in the operative field. Strap muscles were approximated in the midline with interrupted 3-0 Vicryl sutures. Platysma was closed with interrupted 3-0 Vicryl sutures. Marcaine was infiltrated circumferentially. Skin was closed with a running 4-0 Monocryl subcuticular suture. Wound was washed and dried and Dermabond was applied. Patient was awakened from anesthesia and brought to the recovery room. The patient tolerated the procedure well.   Armandina Gemma, MD [facility] Office: [phone]

## 2019-01-23 ENCOUNTER — Encounter (HOSPITAL_COMMUNITY): Payer: Self-pay | Admitting: Surgery

## 2019-01-26 NOTE — Progress Notes (Signed)
Please contact patient and notify of benign pathology results.  Eulia Hatcher M. Kyuss Hale, MD, FACS [facility] Office: [phone]   

## 2019-02-07 DIAGNOSIS — E210 Primary hyperparathyroidism: Secondary | ICD-10-CM | POA: Diagnosis not present

## 2019-03-11 DIAGNOSIS — R7301 Impaired fasting glucose: Secondary | ICD-10-CM | POA: Diagnosis not present

## 2019-03-11 DIAGNOSIS — E559 Vitamin D deficiency, unspecified: Secondary | ICD-10-CM | POA: Diagnosis not present

## 2019-03-11 DIAGNOSIS — E039 Hypothyroidism, unspecified: Secondary | ICD-10-CM | POA: Diagnosis not present

## 2019-03-25 DIAGNOSIS — E559 Vitamin D deficiency, unspecified: Secondary | ICD-10-CM | POA: Diagnosis not present

## 2019-03-25 DIAGNOSIS — R7301 Impaired fasting glucose: Secondary | ICD-10-CM | POA: Diagnosis not present

## 2019-03-25 DIAGNOSIS — E039 Hypothyroidism, unspecified: Secondary | ICD-10-CM | POA: Diagnosis not present

## 2019-03-25 DIAGNOSIS — I10 Essential (primary) hypertension: Secondary | ICD-10-CM | POA: Diagnosis not present

## 2019-03-25 DIAGNOSIS — M810 Age-related osteoporosis without current pathological fracture: Secondary | ICD-10-CM | POA: Diagnosis not present

## 2019-04-18 DIAGNOSIS — D1801 Hemangioma of skin and subcutaneous tissue: Secondary | ICD-10-CM | POA: Diagnosis not present

## 2019-04-18 DIAGNOSIS — D225 Melanocytic nevi of trunk: Secondary | ICD-10-CM | POA: Diagnosis not present

## 2019-04-18 DIAGNOSIS — L814 Other melanin hyperpigmentation: Secondary | ICD-10-CM | POA: Diagnosis not present

## 2019-04-18 DIAGNOSIS — L918 Other hypertrophic disorders of the skin: Secondary | ICD-10-CM | POA: Diagnosis not present

## 2019-04-18 DIAGNOSIS — L218 Other seborrheic dermatitis: Secondary | ICD-10-CM | POA: Diagnosis not present

## 2019-06-05 DIAGNOSIS — W57XXXA Bitten or stung by nonvenomous insect and other nonvenomous arthropods, initial encounter: Secondary | ICD-10-CM | POA: Diagnosis not present

## 2019-06-05 DIAGNOSIS — L03116 Cellulitis of left lower limb: Secondary | ICD-10-CM | POA: Diagnosis not present

## 2019-06-05 DIAGNOSIS — S80862A Insect bite (nonvenomous), left lower leg, initial encounter: Secondary | ICD-10-CM | POA: Diagnosis not present

## 2019-06-20 DIAGNOSIS — R5383 Other fatigue: Secondary | ICD-10-CM | POA: Diagnosis not present

## 2019-06-20 DIAGNOSIS — E039 Hypothyroidism, unspecified: Secondary | ICD-10-CM | POA: Diagnosis not present

## 2019-06-20 DIAGNOSIS — R7301 Impaired fasting glucose: Secondary | ICD-10-CM | POA: Diagnosis not present

## 2019-06-20 DIAGNOSIS — E210 Primary hyperparathyroidism: Secondary | ICD-10-CM | POA: Diagnosis not present

## 2019-06-20 DIAGNOSIS — E559 Vitamin D deficiency, unspecified: Secondary | ICD-10-CM | POA: Diagnosis not present

## 2019-09-12 ENCOUNTER — Other Ambulatory Visit: Payer: Self-pay

## 2019-09-12 NOTE — Patient Outreach (Signed)
[place] [facility]) Care Management  09/12/2019  Laura Madden 1946/03/18 [id number]   Medication Adherence call to [name] HIPPA Compliant Voice message left with a call back number,patient did not answer patient is past due on Lisinopril 10 mg under [place].   [place] Management Direct Dial [phone]  Fax [phone] [name]

## 2019-09-20 ENCOUNTER — Other Ambulatory Visit: Payer: Self-pay

## 2019-09-20 NOTE — Patient Outreach (Signed)
[place] [facility]) Care Management  09/20/2019  ALLISYN SCHEIBNER [birth year] [id number]   Medication Adherence call to [name] HIPPA Compliant Voice message left with a call back number. Mrs. Riedlinger is showing past due on Lisinopril 10 mg under [place].   [place] Management Direct Dial [phone]  Fax [phone] [email or web address]

## 2019-09-25 DIAGNOSIS — E039 Hypothyroidism, unspecified: Secondary | ICD-10-CM | POA: Diagnosis not present

## 2019-09-25 DIAGNOSIS — R7301 Impaired fasting glucose: Secondary | ICD-10-CM | POA: Diagnosis not present

## 2019-09-25 DIAGNOSIS — E559 Vitamin D deficiency, unspecified: Secondary | ICD-10-CM | POA: Diagnosis not present

## 2019-09-25 DIAGNOSIS — E210 Primary hyperparathyroidism: Secondary | ICD-10-CM | POA: Diagnosis not present

## 2019-09-30 DIAGNOSIS — M810 Age-related osteoporosis without current pathological fracture: Secondary | ICD-10-CM | POA: Diagnosis not present

## 2019-09-30 DIAGNOSIS — E559 Vitamin D deficiency, unspecified: Secondary | ICD-10-CM | POA: Diagnosis not present

## 2019-09-30 DIAGNOSIS — I10 Essential (primary) hypertension: Secondary | ICD-10-CM | POA: Diagnosis not present

## 2019-09-30 DIAGNOSIS — R7301 Impaired fasting glucose: Secondary | ICD-10-CM | POA: Diagnosis not present

## 2019-09-30 DIAGNOSIS — E039 Hypothyroidism, unspecified: Secondary | ICD-10-CM | POA: Diagnosis not present

## 2019-11-29 IMAGING — US US RENAL
1 series · 14 of 25 positions shown · non-contrast
Comparison: None.

CLINICAL DATA: Hematuria.

EXAM:
RENAL / URINARY TRACT ULTRASOUND COMPLETE

[Series 1: us renal · 0.25mm/px · 14 of 32 slices shown]
[im 1/32]
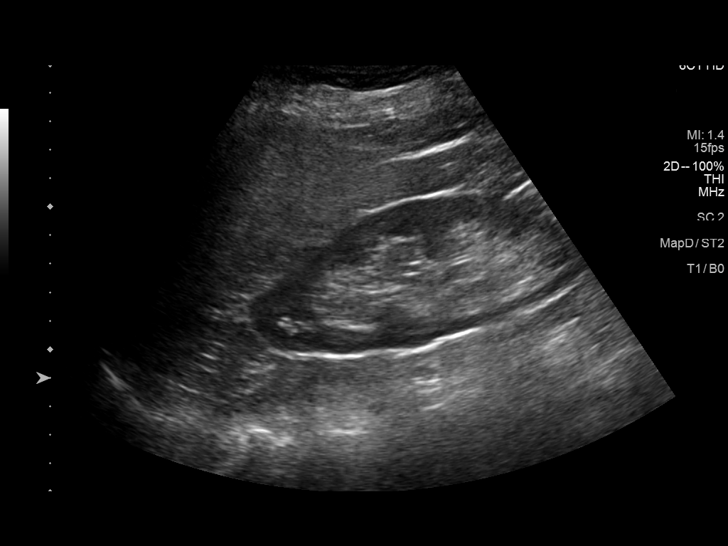
[im 3/32]
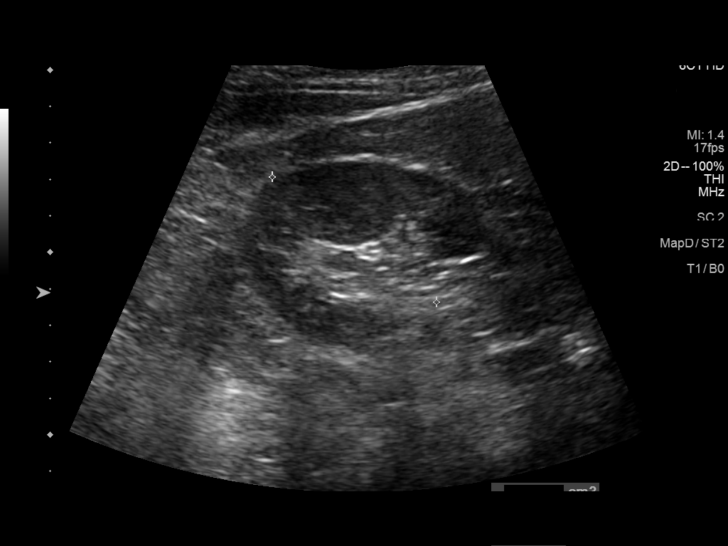
[im 6/32]
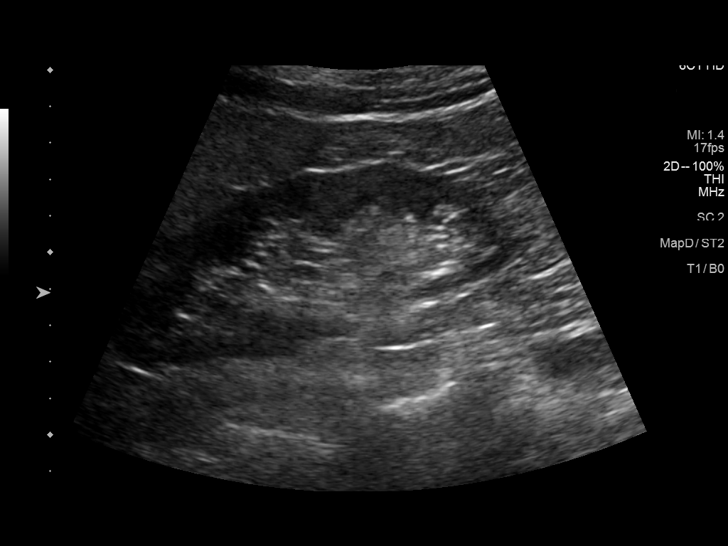
[im 8/32]
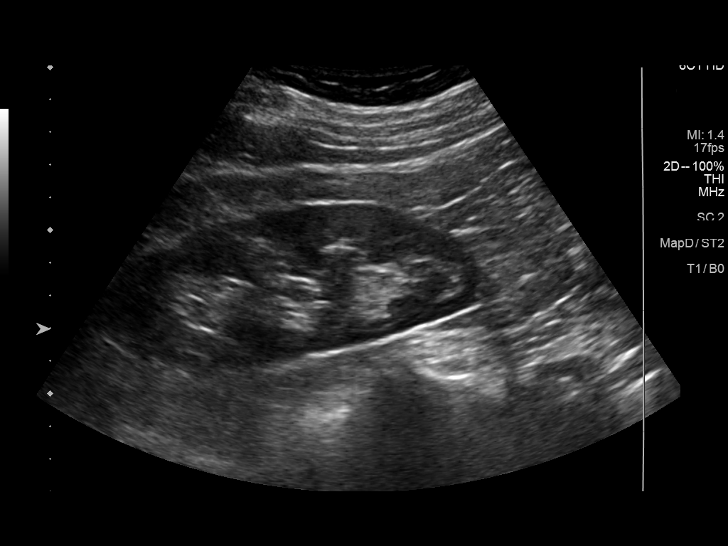
[im 11/32]
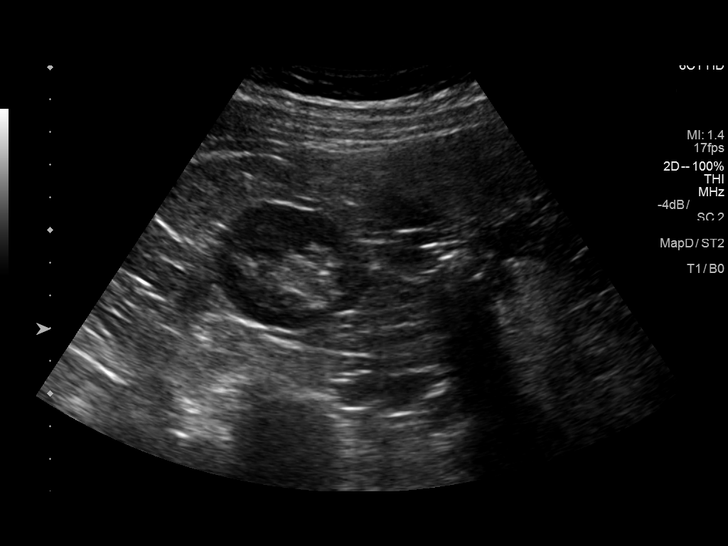
[im 12/32]
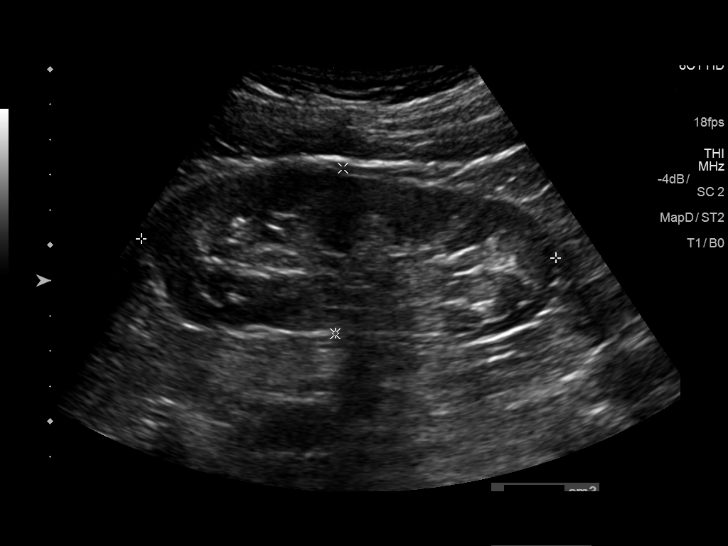
[im 15/32]
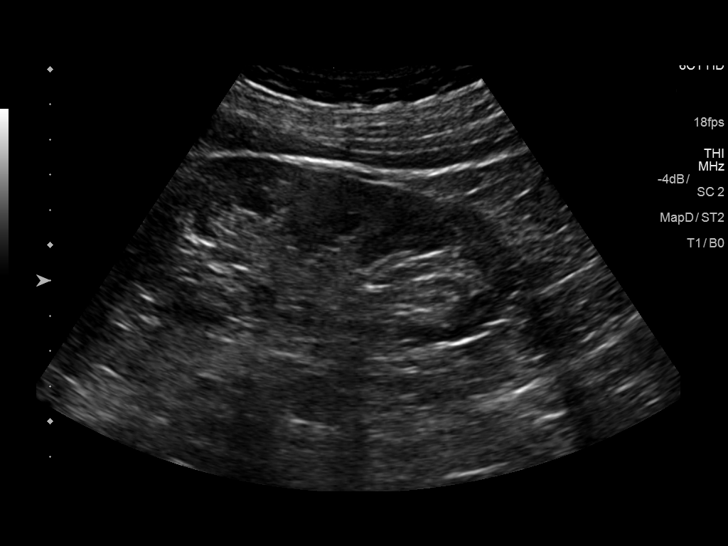
[im 17/32]
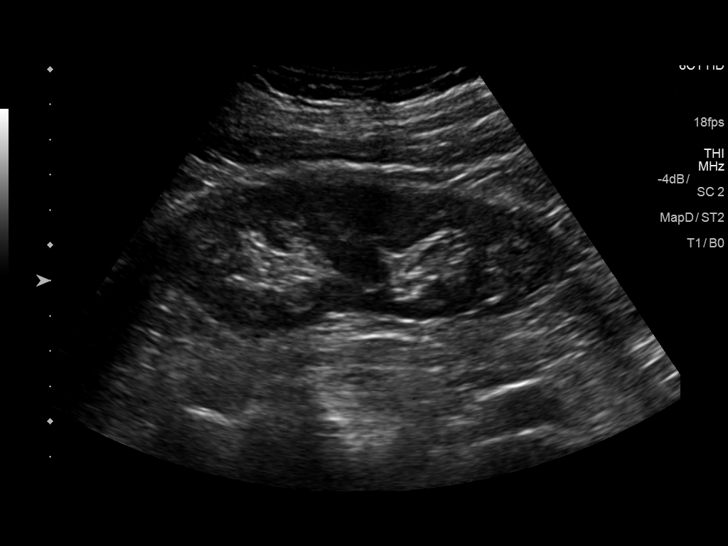
[im 20/32]
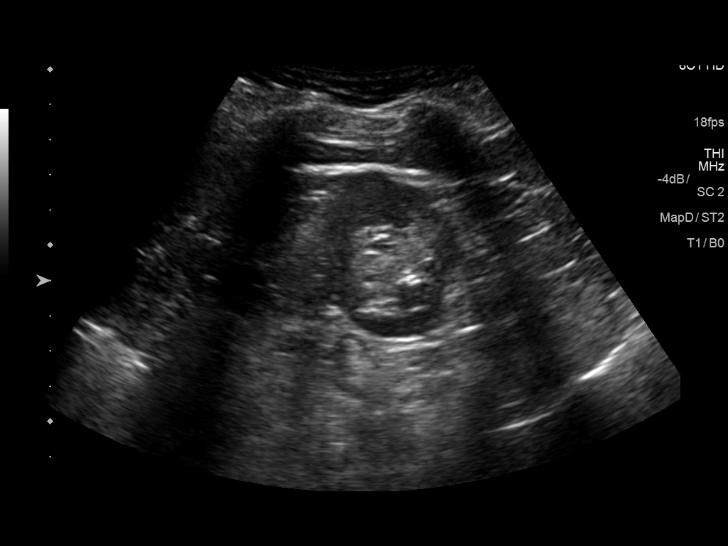
[im 21/32]
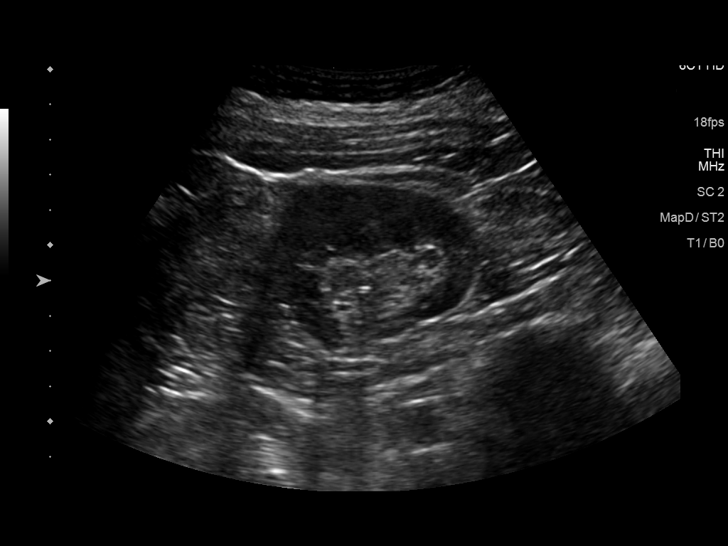
[im 24/32]
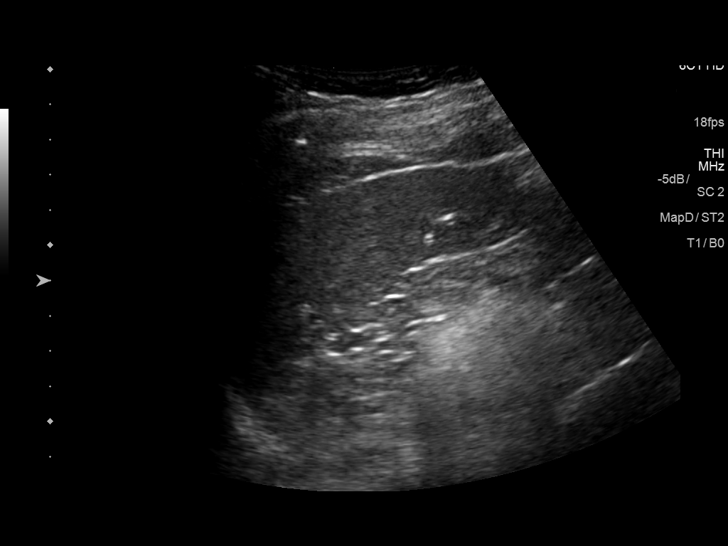
[im 26/32]
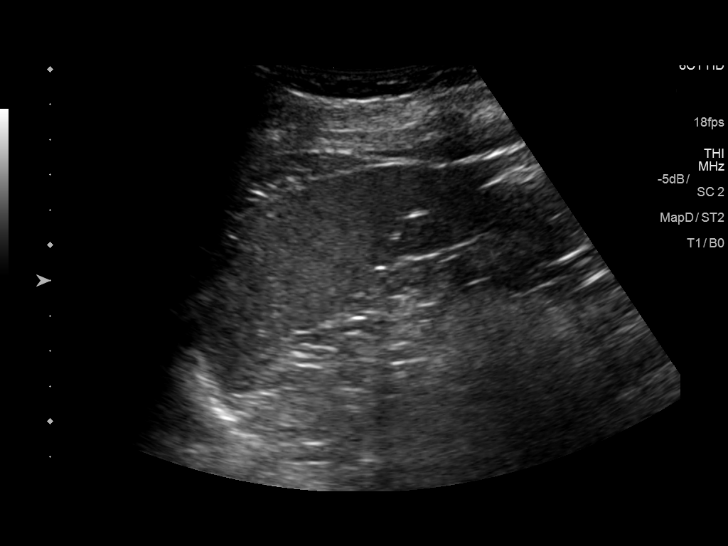
[im 29/32]
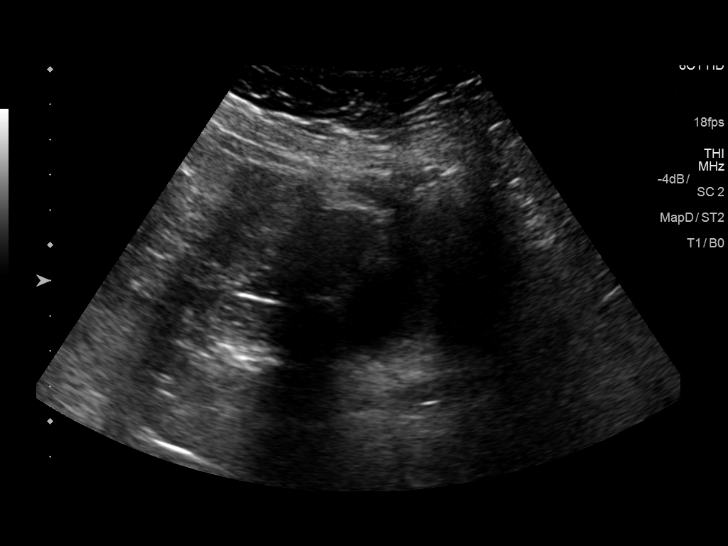
[im 32/32]
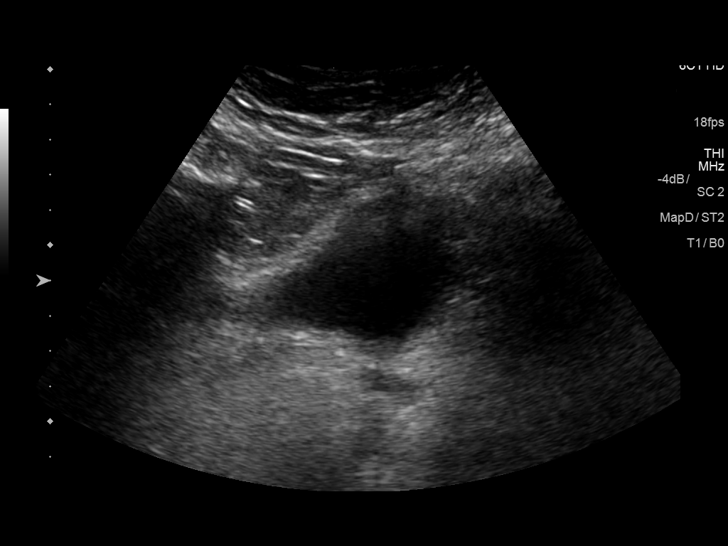

[14 of 25 positions shown; findings below may reference images not displayed]

FINDINGS: Right Kidney:

Renal measurements: 12.0 x 5.7 x 5.0 cm = volume: 163 mL .
Echogenicity within normal limits. No mass or hydronephrosis
visualized.

Left Kidney:

Renal measurements: 11.8 x 4.7 x 4.6 cm = volume: 133 mL.
Echogenicity within normal limits. No mass or hydronephrosis
visualized.

Bladder:

Appears normal for degree of bladder distention.
IMPRESSION: Normal renal ultrasound.

## 2019-12-12 ENCOUNTER — Ambulatory Visit: Payer: Medicare Other

## 2019-12-15 ENCOUNTER — Ambulatory Visit: Payer: Medicare Other

## 2019-12-17 ENCOUNTER — Ambulatory Visit: Payer: Medicare Other

## 2019-12-20 ENCOUNTER — Ambulatory Visit: Payer: Medicare Other

## 2020-03-11 DIAGNOSIS — M810 Age-related osteoporosis without current pathological fracture: Secondary | ICD-10-CM | POA: Diagnosis not present

## 2020-03-11 DIAGNOSIS — I10 Essential (primary) hypertension: Secondary | ICD-10-CM | POA: Diagnosis not present

## 2020-03-11 DIAGNOSIS — R7301 Impaired fasting glucose: Secondary | ICD-10-CM | POA: Diagnosis not present

## 2020-03-11 DIAGNOSIS — E039 Hypothyroidism, unspecified: Secondary | ICD-10-CM | POA: Diagnosis not present

## 2020-09-06 IMAGING — DX DG CHEST 2V
2 series · 2 of 2 positions shown · non-contrast
Comparison: None.

CLINICAL DATA: Postnasal drip and cough. Preoperative testing for
thyroid disease.

EXAM:
CHEST - 2 VIEW

[chest pa]
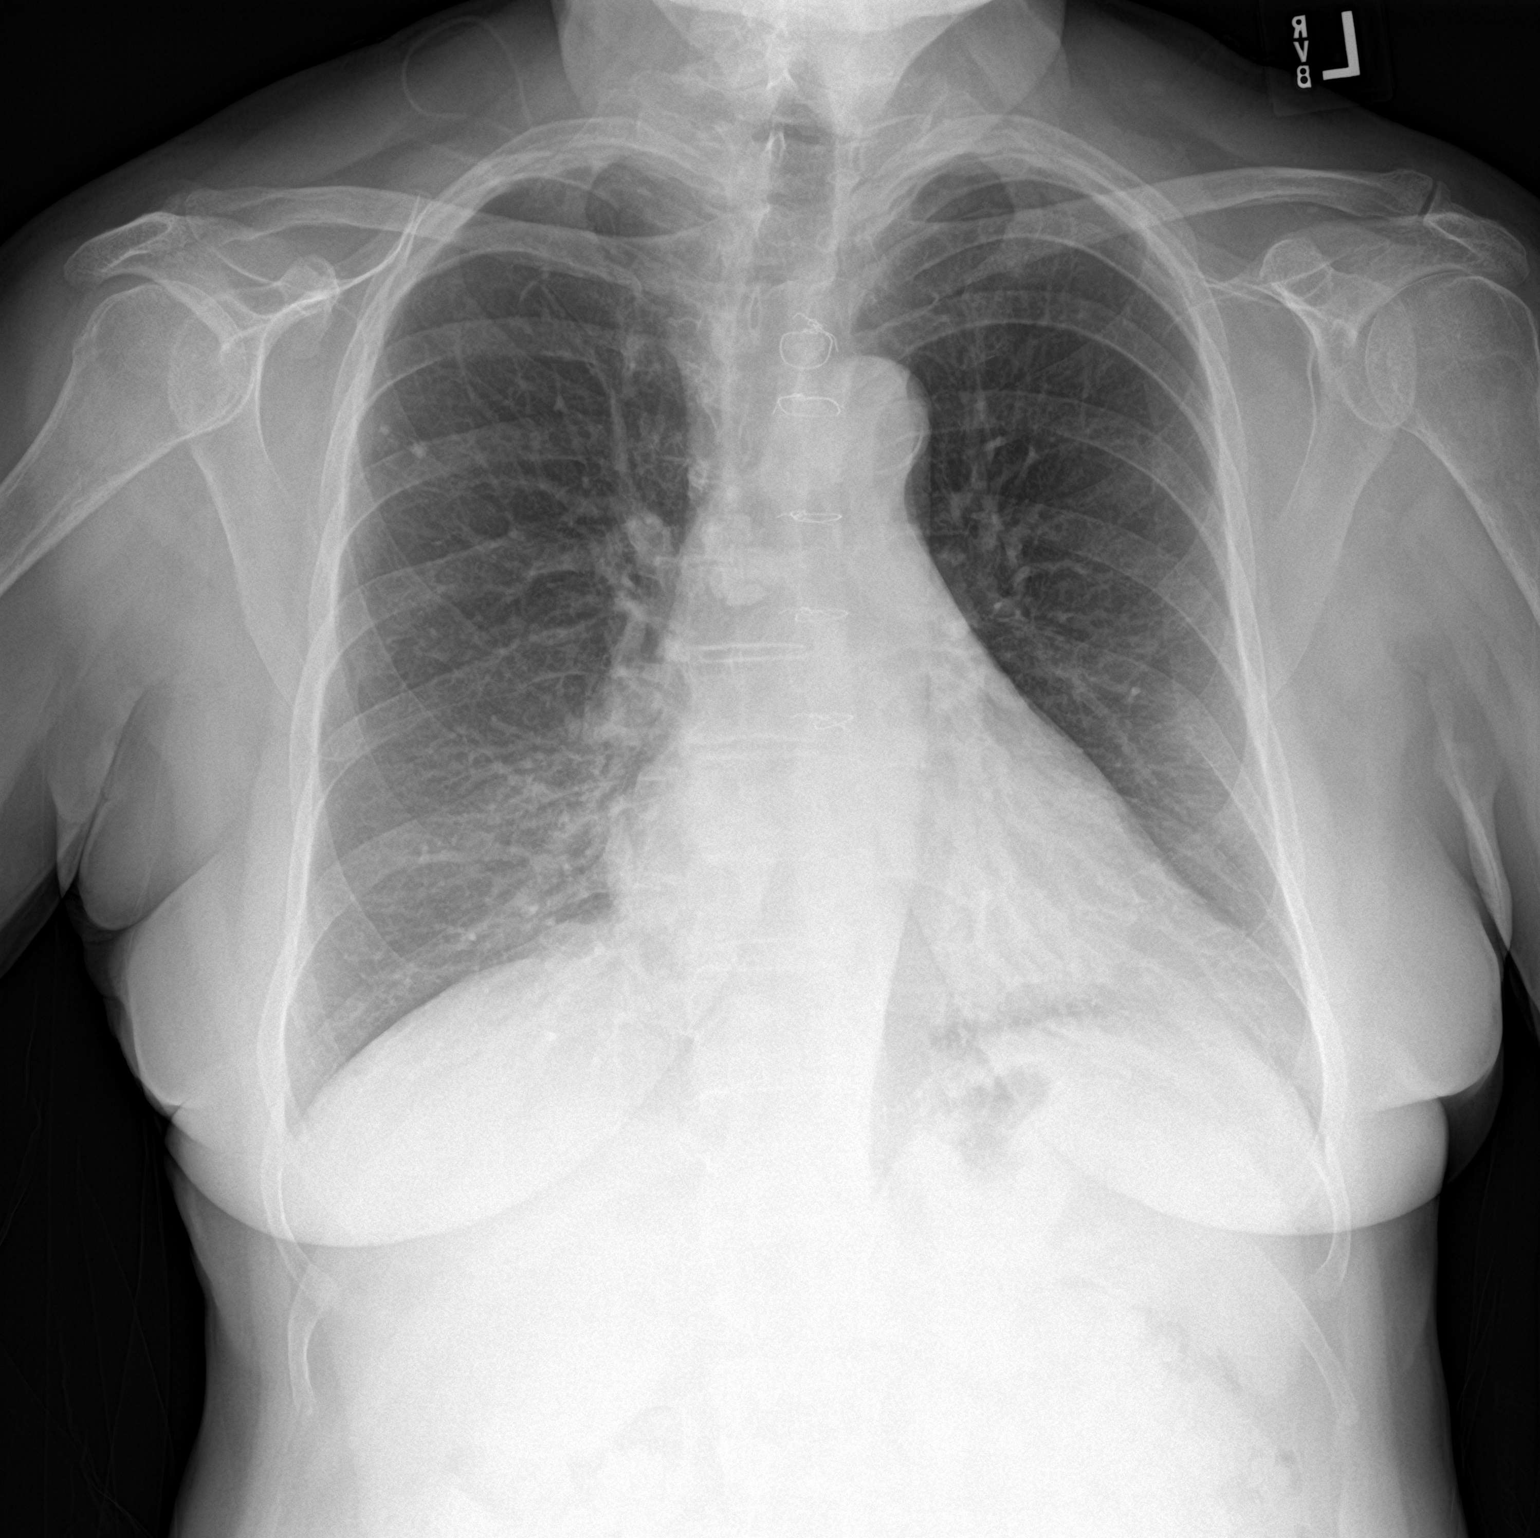

[chest lat]
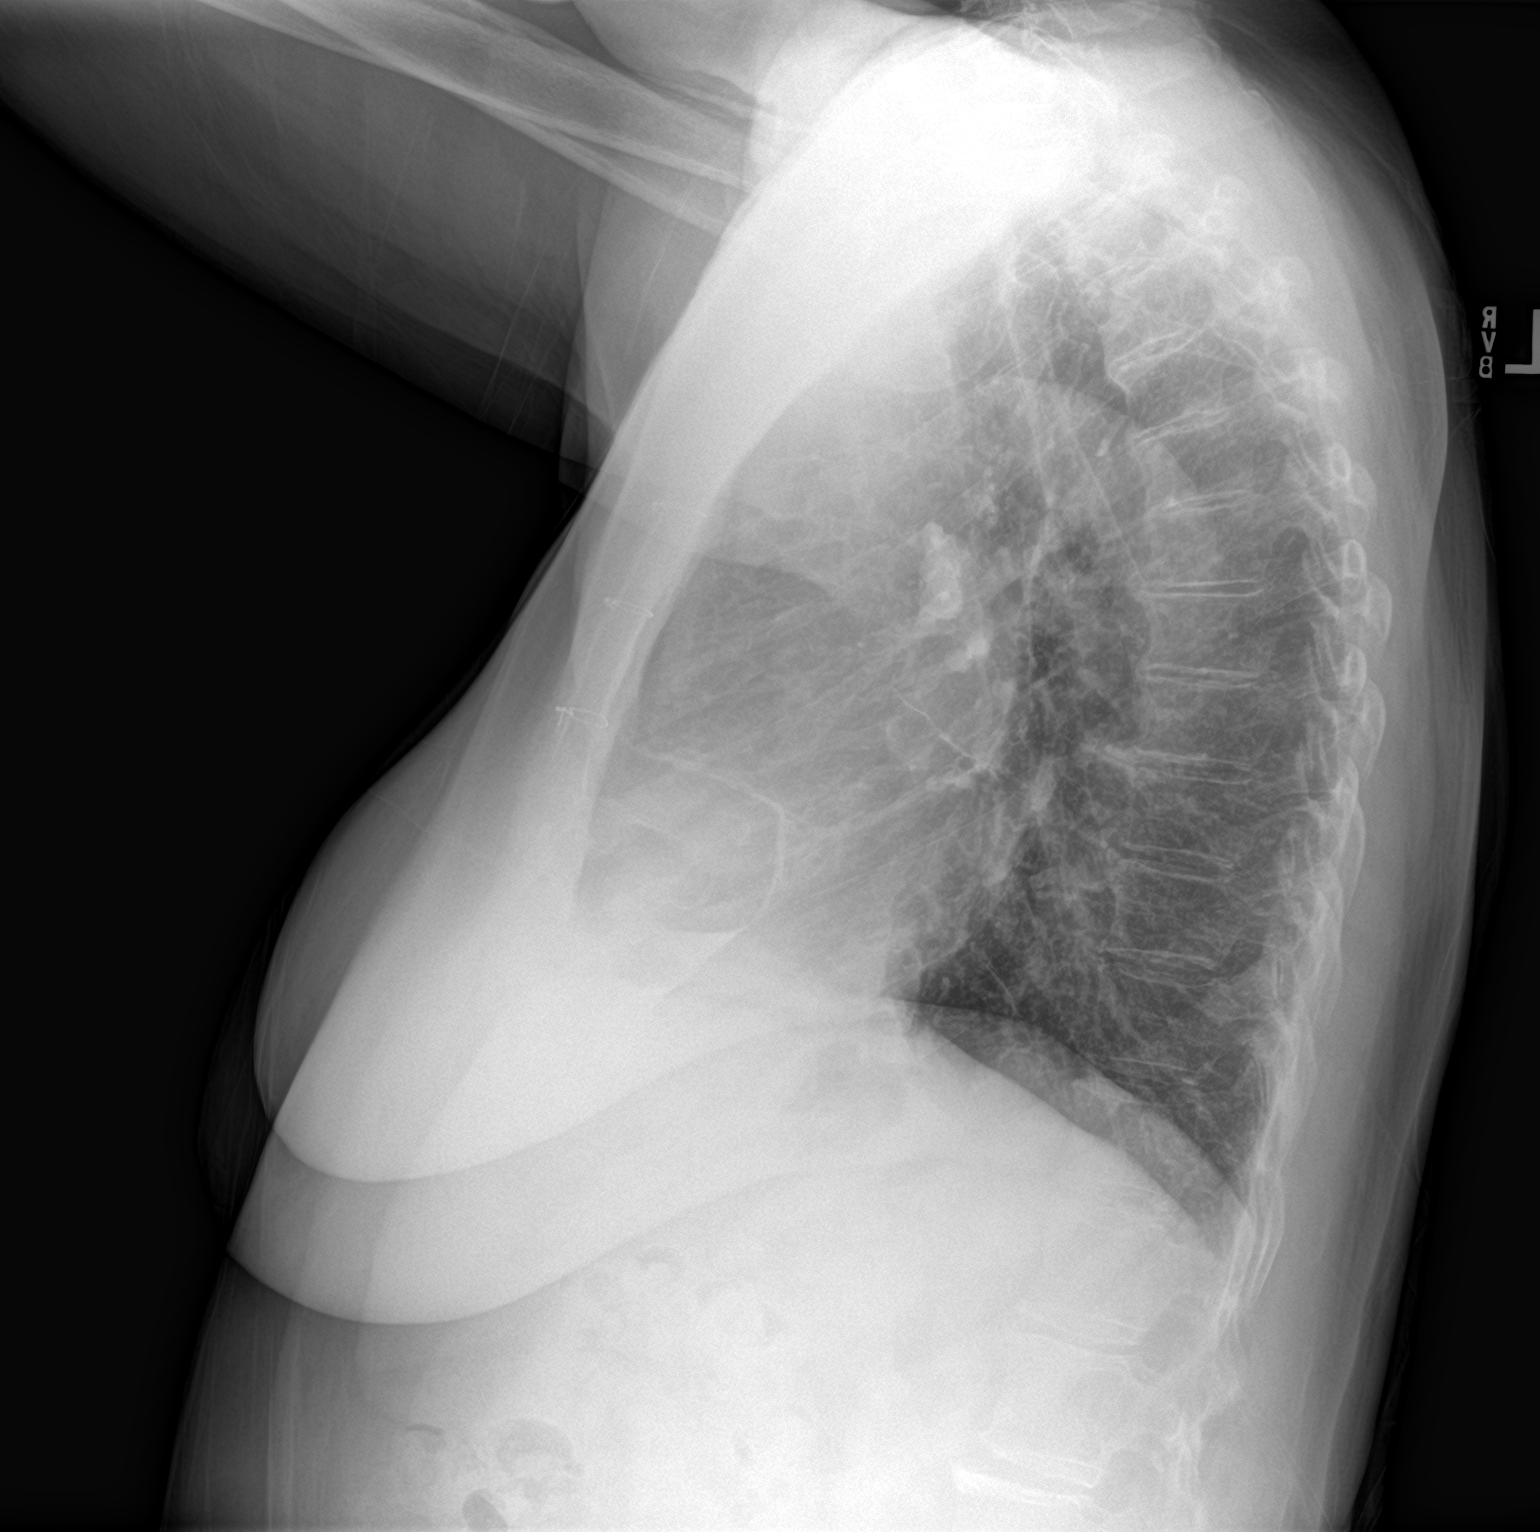

[2 of 2 positions shown; findings below may reference images not displayed]

FINDINGS: Intact sternotomy wires. Normal heart size. Atherosclerotic aortic
arch. Calcified paratracheal nodes from prior granulomatous disease.
Otherwise normal mediastinal contour. No pneumothorax. No pleural
effusion. Tiny calcified right upper lung granulomas. No pulmonary
edema. Mild streaky anterior lung base opacities partially obscuring
the heart borders bilaterally. No acute consolidative airspace
disease.
IMPRESSION: Mild streaky anterior lung base opacities partially obscuring the
heart borders bilaterally, favor scarring or atelectasis. No acute
consolidative airspace disease to suggest a pneumonia.

## 2024-05-31 ENCOUNTER — Encounter: Payer: Self-pay | Admitting: Advanced Practice Midwife
